# Patient Record
Sex: Female | Born: 1975 | Hispanic: Yes | Marital: Single | Smoking: Never smoker
Health system: Southern US, Community
[De-identification: ages and names within clinical notes are randomized; demographics above are authoritative.]

## PROBLEM LIST (undated history)

## (undated) DIAGNOSIS — K219 Gastro-esophageal reflux disease without esophagitis: Secondary | ICD-10-CM

## (undated) DIAGNOSIS — T7840XA Allergy, unspecified, initial encounter: Secondary | ICD-10-CM

## (undated) DIAGNOSIS — G5700 Lesion of sciatic nerve, unspecified lower limb: Secondary | ICD-10-CM

## (undated) DIAGNOSIS — E785 Hyperlipidemia, unspecified: Secondary | ICD-10-CM

## (undated) HISTORY — DX: Lesion of sciatic nerve, unspecified lower limb: G57.00

## (undated) HISTORY — PX: COLONOSCOPY: SHX174

## (undated) HISTORY — PX: ORIF ANKLE FRACTURE BIMALLEOLAR: SUR920

## (undated) HISTORY — PX: HARDWARE REMOVAL: SHX979

## (undated) HISTORY — DX: Gastro-esophageal reflux disease without esophagitis: K21.9

## (undated) HISTORY — DX: Hyperlipidemia, unspecified: E78.5

## (undated) HISTORY — DX: Allergy, unspecified, initial encounter: T78.40XA

## (undated) HISTORY — PX: UPPER GASTROINTESTINAL ENDOSCOPY: SHX188

---

## 2020-06-20 ENCOUNTER — Ambulatory Visit (INDEPENDENT_AMBULATORY_CARE_PROVIDER_SITE_OTHER): Payer: 59 | Admitting: Family Medicine

## 2020-06-20 ENCOUNTER — Encounter: Payer: Self-pay | Admitting: Family Medicine

## 2020-06-20 ENCOUNTER — Telehealth: Payer: Self-pay

## 2020-06-20 ENCOUNTER — Other Ambulatory Visit: Payer: Self-pay

## 2020-06-20 VITALS — BP 114/76 | HR 88 | Temp 98.2°F | Ht 62.0 in | Wt 191.6 lb

## 2020-06-20 DIAGNOSIS — Z1159 Encounter for screening for other viral diseases: Secondary | ICD-10-CM

## 2020-06-20 DIAGNOSIS — Z8742 Personal history of other diseases of the female genital tract: Secondary | ICD-10-CM | POA: Diagnosis not present

## 2020-06-20 DIAGNOSIS — Z8781 Personal history of (healed) traumatic fracture: Secondary | ICD-10-CM | POA: Diagnosis not present

## 2020-06-20 DIAGNOSIS — Z131 Encounter for screening for diabetes mellitus: Secondary | ICD-10-CM | POA: Diagnosis not present

## 2020-06-20 DIAGNOSIS — Z13220 Encounter for screening for lipoid disorders: Secondary | ICD-10-CM

## 2020-06-20 DIAGNOSIS — N912 Amenorrhea, unspecified: Secondary | ICD-10-CM

## 2020-06-20 DIAGNOSIS — Z1231 Encounter for screening mammogram for malignant neoplasm of breast: Secondary | ICD-10-CM

## 2020-06-20 DIAGNOSIS — M5416 Radiculopathy, lumbar region: Secondary | ICD-10-CM | POA: Diagnosis not present

## 2020-06-20 DIAGNOSIS — M5432 Sciatica, left side: Secondary | ICD-10-CM

## 2020-06-20 DIAGNOSIS — E785 Hyperlipidemia, unspecified: Secondary | ICD-10-CM | POA: Insufficient documentation

## 2020-06-20 DIAGNOSIS — Z114 Encounter for screening for human immunodeficiency virus [HIV]: Secondary | ICD-10-CM

## 2020-06-20 DIAGNOSIS — R7303 Prediabetes: Secondary | ICD-10-CM | POA: Insufficient documentation

## 2020-06-20 LAB — LIPID PANEL
Cholesterol: 241 mg/dL — ABNORMAL HIGH (ref 0–200)
HDL: 43.8 mg/dL (ref >39.00)
NonHDL: 197.65
Total CHOL/HDL Ratio: 6
Triglycerides: 276 mg/dL — ABNORMAL HIGH (ref 0.0–149.0)
VLDL: 55.2 mg/dL — ABNORMAL HIGH (ref 0.0–40.0)

## 2020-06-20 LAB — BASIC METABOLIC PANEL
BUN: 12 mg/dL (ref 6–23)
CO2: 30 mEq/L (ref 19–32)
Calcium: 9.7 mg/dL (ref 8.4–10.5)
Chloride: 101 mEq/L (ref 96–112)
Creatinine, Ser: 0.61 mg/dL (ref 0.40–1.20)
GFR: 107.51 mL/min (ref >60.00)
Glucose, Bld: 108 mg/dL — ABNORMAL HIGH (ref 70–99)
Potassium: 4.1 mEq/L (ref 3.5–5.1)
Sodium: 139 mEq/L (ref 135–145)

## 2020-06-20 LAB — HEMOGLOBIN A1C: Hgb A1c MFr Bld: 6.4 % (ref 4.6–6.5)

## 2020-06-20 LAB — LDL CHOLESTEROL, DIRECT: Direct LDL: 152 mg/dL

## 2020-06-20 MED ORDER — IBUPROFEN 800 MG PO TABS: 800.0000 mg | ORAL_TABLET | Freq: Three times a day (TID) | ORAL | 5 refills | Status: AC | PRN

## 2020-06-20 NOTE — Telephone Encounter (Signed)
Patient will need to have blood work done when she comes back on 06/26/20 appt due to not enough blood was drawn and provider added more labs 1 hour after she was gone.   Dm/cma

## 2020-06-20 NOTE — Progress Notes (Signed)
[facility] [address] [place] [address] Dept: [phone] Dept Fax: [phone]  New Patient Office Visit  Subjective:    Patient ID: Theresa Hinton, female    DOB: [birth year], 45 y.o..   MRN: [id number]  Chief Complaint  Patient presents with  . Establish Care    NP- CPE/labs.  Fasting today.  C/o weight gain despite dieting, sciatic pain, and bilateral foot pain(LT worse). Declines flu shot.     History of Present Illness:  Visit was assisted by an interpreter Cletus Gash, [place] # [phone])  Patient is in today to establish care. Ms. Theresa Hinton is an immigrant from [place] having moved to the [place] about 17 years ago. She previously lived in [place]. She has a long-standing relationship with her S.O. She has two children (17, 11). 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 She denies tobacco and drug use. She admits to social drinking on occasion.  Ms. Theresa Hinton has a history of low back pain associated with a pinched nerve. These symptoms began after she had a fall in 2014. She notes she had an MRI that apparently demonstrated a pinched nerve. She was referred for physical therapy. She also had a series of injections, which she did not find to have helped her issue. She has some associated sciatica into the left buttocks region. She uses ibuprofen 800 mg occasionally to help with the pain, finding lesser doses are not as helpful. She does not take this daily.  Ms. Theresa Hinton has a past history of a fracture of the left ankle. She had an ORIF about 7 years ago and then subsequent hardware removal. She does get some pain and tenderness around the ankle at times, for which she takes ibuprofen as well.  Ms. Theresa Hinton notes she had a past history of left breast swelling and tenderness. She underwent a mammogram and was told this was a "gland" and not cancer in the breast. She remains a bit  concerned about her breast health.  Ms. Theresa Hinton also notes she entered in menopause about 5 years ago. She started to have menstrual irregularity around age 46, with some missed periods and other prolonged periods. She was on contraceptives for a bit of time in the past.  Past Medical History: Patient Active Problem List   Diagnosis Date Noted  . Sciatica of left side 06/20/2020  . History of breast pain 06/20/2020  . Left lumbar radiculitis 06/20/2020  . History of fracture of left ankle 06/20/2020   Past Surgical History:  Procedure Laterality Date  . CESAREAN SECTION    . HARDWARE REMOVAL Left   . ORIF ANKLE FRACTURE BIMALLEOLAR Left    Family History  Problem Relation Age of Onset  . Hyperlipidemia Mother   . Diabetes Father    Outpatient Medications Prior to Visit  Medication Sig Dispense Refill  . ibuprofen (ADVIL) 800 MG tablet Take 800 mg by mouth every 8 (eight) hours as needed.     No facility-administered medications prior to visit.   No Known Allergies    Objective:   Today's Vitals   06/20/20 0916  BP: 114/76  Pulse: 88  Temp: 98.2 F (36.8 C)  TempSrc: Temporal  SpO2: 95%  Weight: 191 lb 9.6 oz (86.9 kg)  Height: 5\' 2"  (1.575 m)   Body mass index is 35.04 kg/m.   General: Well developed, well nourished. No acute distress. Back: Pain to palpation over the left paraspinal muscles and down  the left posterior buttocks. Extremities: + SLR on the left. Well-healed surgical scars aroudn the left ankle. There is moderate tenderness to   palpation. Psych: Alert and oriented. Normal mood and affect.  Health Maintenance Due  Topic Date Due  . Hepatitis C Screening  Never done  . TETANUS/TDAP  Never done  . PAP SMEAR-Modifier  Never done  . COLONOSCOPY (Pts 45-35yrs Insurance coverage will need to be confirmed)  Never done  . INFLUENZA VACCINE  Never done  . COVID-19 Vaccine (3 - Booster for Pfizer series) 03/30/2020     Assessment & Plan:   1.  Sciatica of left side Ms. Theresa Hinton seems to be managing with ibuprofen for this condition. I am okay with her continueing intermittent use, but want to assess renal function. We might consider a repeat referral to PT, if this persists.  - Basic metabolic panel - ibuprofen (ADVIL) 800 MG tablet; Take 1 tablet (800 mg total) by mouth every 8 (eight) hours as needed.  Dispense: 30 tablet; Refill: 5  2. Left lumbar radiculitis Stable on ibuprofen. If she becomes more symptomatic, she may need referral to a local neurosurgeon for evaluation.  - ibuprofen (ADVIL) 800 MG tablet; Take 1 tablet (800 mg total) by mouth every 8 (eight) hours as needed.  Dispense: 30 tablet; Refill: 5  3. History of breast pain We discussed whether to continue breast cancer screening at this point, It is reasonable for her to have a mammogram at 47, esp. due to the past concerns, so will refer her.  - MM DIGITAL SCREENING BILATERAL; Future  4. History of fracture of left ankle Patient is managing with ankle discomfort at this point. I would consider referral or PT int he future, if the issue starts to impair her ability to work.  5. Amenorrhea Recommend we confirm menopause, as she was  younger than expected when she stopped her menses.  - FSH/LH  6. Screening for diabetes mellitus (DM)  - Hemoglobin A1c  7. Screening for lipid disorders  - Lipid panel  8. Encounter for hepatitis C screening test for low risk patient  - HCV Ab w Reflex to Quant PCR  9. Screening for HIV (human immunodeficiency virus)  - HIV Antibody (routine testing w rflx)   Haydee Salter, MD

## 2020-06-26 ENCOUNTER — Ambulatory Visit (INDEPENDENT_AMBULATORY_CARE_PROVIDER_SITE_OTHER): Payer: 59 | Admitting: Family Medicine

## 2020-06-26 ENCOUNTER — Other Ambulatory Visit: Payer: Self-pay

## 2020-06-26 ENCOUNTER — Encounter: Payer: Self-pay | Admitting: Family Medicine

## 2020-06-26 ENCOUNTER — Other Ambulatory Visit (HOSPITAL_COMMUNITY)
Admission: RE | Admit: 2020-06-26 | Discharge: 2020-06-26 | Disposition: A | Discharge status: Home / Self Care | Payer: 59 | Source: Ambulatory Visit | Attending: Diagnostic Radiology | Admitting: Diagnostic Radiology

## 2020-06-26 VITALS — BP 118/76 | HR 92 | Temp 97.8°F | Ht 62.0 in | Wt 192.4 lb

## 2020-06-26 DIAGNOSIS — R7303 Prediabetes: Secondary | ICD-10-CM | POA: Diagnosis not present

## 2020-06-26 DIAGNOSIS — Z8742 Personal history of other diseases of the female genital tract: Secondary | ICD-10-CM

## 2020-06-26 DIAGNOSIS — E785 Hyperlipidemia, unspecified: Secondary | ICD-10-CM | POA: Diagnosis not present

## 2020-06-26 DIAGNOSIS — M7711 Lateral epicondylitis, right elbow: Secondary | ICD-10-CM | POA: Diagnosis not present

## 2020-06-26 DIAGNOSIS — Z124 Encounter for screening for malignant neoplasm of cervix: Secondary | ICD-10-CM | POA: Diagnosis present

## 2020-06-26 MED ORDER — NAPROXEN SODIUM 220 MG PO TABS: 220.0000 mg | ORAL_TABLET | Freq: Two times a day (BID) | ORAL | 0 refills | Status: AC

## 2020-06-26 NOTE — Progress Notes (Signed)
[facility] [address] [place] [address] Dept: [phone] Dept Fax: [phone]  Acute Office Visit  Subjective:    Patient ID: Theresa Hinton, female    DOB: [birth year], 45 y.o..   MRN: [id number]  Chief Complaint  Patient presents with  . Gynecologic Exam    Pap only, also want to know how to get a mammogram without insurance.  Discuss results from last week.     History of Present Illness:  Patient is in today for pap smear screening and to discuss the results of her recent lab tests. She notes that she continues to have some episodic low back pain, esp. associated with her work. More recently, she has been having pain in her right elbow. This is esp. tender during and after work.  Past Medical History: Patient Active Problem List   Diagnosis Date Noted  . Sciatica of left side 06/20/2020  . History of breast pain 06/20/2020  . Left lumbar radiculitis 06/20/2020  . History of fracture of left ankle 06/20/2020  . Prediabetes 06/20/2020  . Borderline hyperlipidemia 06/20/2020   Past Surgical History:  Procedure Laterality Date  . CESAREAN SECTION    . HARDWARE REMOVAL Left   . ORIF ANKLE FRACTURE BIMALLEOLAR Left    Family History  Problem Relation Age of Onset  . Hyperlipidemia Mother   . Diabetes Father    Outpatient Medications Prior to Visit  Medication Sig Dispense Refill  . ibuprofen (ADVIL) 800 MG tablet Take 1 tablet (800 mg total) by mouth every 8 (eight) hours as needed. 30 tablet 5   No facility-administered medications prior to visit.   No Known Allergies    Objective:   Today's Vitals   06/26/20 1529  BP: 118/76  Pulse: 92  Temp: 97.8 F (36.6 C)  TempSrc: Temporal  SpO2: 98%  Weight: 192 lb 6.4 oz (87.3 kg)  Height: 5\' 2"  (1.575 m)   Body mass index is 35.19 kg/m.   General: Well developed, well nourished. No acute distress. Extremities: Pain localized over right  lateral epicondyle No joint swelling or tenderness. No edema noted. GU: External genitalia normal. Vaginal mucosa is pink. Cervix pink without lesions or exudates. No palpable utere or   adnexal masses. Psych: Alert and oriented. Normal mood and affect.  Health Maintenance Due  Topic Date Due  . Hepatitis C Screening  Never done  . TETANUS/TDAP  Never done  . PAP SMEAR-Modifier  Never done  . COLONOSCOPY (Pts 45-31yrs Insurance coverage will need to be confirmed)  Never done  . INFLUENZA VACCINE  Never done  . COVID-19 Vaccine (3 - Booster for Pfizer series) 03/30/2020   Lab results:  Lab Results  Component Value Date   HGBA1C 6.4 06/20/2020   Lab Results  Component Value Date   CHOL 241 (H) 06/20/2020   HDL 43.80 06/20/2020   LDLDIRECT 152.0 06/20/2020   TRIG 276.0 (H) 06/20/2020   CHOLHDL 6 06/20/2020     Assessment & Plan:   1. Encounter for Papanicolaou smear for cervical cancer screening  2. Prediabetes We discussed her HbA1c being in the prediabetes range. I recommended she work to lose weight through dietary changes and exercise. She is already working on improving her diet. I suggested a goal of 30 min. of walking 3-4 times a week.  3. Borderline hyperlipidemia Discussed that current elevations are borderline high. Her ACC/AHA 10-year cardiac risk is low (<2%). The same advice concerning reducing risk for diabetes  applies.  4. History of breast pain She notes she was unable to get her insurance to approve her mammogram. The staff will work to recheck on approval or to find options to help her afford this.  5. Lateral epicondylitis of right elbow Her exam is consistent with lateral epicondylitis.She was provided patient information in Spanish regarding the condition. I recommend she apply ice to this area after work. I will place her on a 2 week course of Aleve to see if we can get this to resolve. If this persists, would consider physical therapy referral.  -  naproxen sodium (ALEVE) 220 MG tablet; Take 1 tablet (220 mg total) by mouth in the morning and at bedtime for 14 days.  Dispense: 28 tablet; Refill: 0  Haydee Salter, MD

## 2020-06-26 NOTE — Addendum Note (Signed)
Addended by: Haydee Salter on: 06/26/2020 04:21 PM   Modules accepted: Orders

## 2020-06-26 NOTE — Patient Instructions (Signed)
Codo de [place] Tennis Elbow  El codo de tenista es la irritacin e hinchazn (inflamacin) en la zona externa del Joycie Peek, cerca del codo. La hinchazn afecta los tejidos que conectan el msculo al hueso (tendones). El codo de [place] puede presentarse al Psychologist, prison and probation services cualquier deporte o Optometrist una tarea que exige el uso excesivo del codo. La causa del codo de [place] es la repeticin del mismo movimiento una y Elmon Kirschner. Cules son las causas? Esta afeccin a menudo se produce por practicar deportes o realizar tareas en los que tiene que mover el antebrazo de la [place]. A veces, la afeccin puede deberse a una lesin repentina. Qu incrementa el riesgo? Tiene ms probabilidades de tener codo de tenista si juega al tenis o practica otro deporte con raqueta. Tambin tiene un mayor riesgo si [place] frecuentemente las manos para Fish farm manager. Esto puede comprender lo siguiente:  Usuarios de computadoras.  Trabajadores de Programme researcher, broadcasting/film/video.  Personas que trabajan en una fbrica.  Msicos.  Cocineros.  Cajeros. Cules son los signos o sntomas?  Dolor y sensibilidad a la palpacin en el Management consultant y la parte externa del codo. Puede sentir dolor todo Physiological scientist o solo cuando [place] el brazo.  Sensacin de ardor. Esta empieza en el codo y se extiende por el brazo.  Agarre dbil en la mano. Cmo se trata? Descansar el brazo y Midwife hielo es Sports coach. El mdico tambin puede recomendarle:  Medicamentos para reducir Conservation officer, historic buildings y la hinchazn.  Una correa para codo.  Fisioterapia. Este tratamiento puede incluir masajes o ejercicios, o ambos.  Un dispositivo ortopdico para el codo. Si estos no ayudan a que sus sntomas mejoren, el mdico puede recomendarle [place]. Siga estas instrucciones en su casa: Si tiene un dispositivo ortopdico o una correa:  Use el dispositivo ortopdico o la correa como se lo haya indicado el mdico. Quteselos solamente como se lo haya indicado  el mdico.  Controle la piel alrededor del dispositivo ortopdico o la correa todos [place]. Comunquese con su mdico si ve algn problema.  Afljeselos si los dedos de la mano: ? Hormiguean. ? Se adormecen. ? Se tornan fros y de [employer].  Mantenga la correa o el dispositivo ortopdico limpio.  Si el dispositivo ortopdico o la correa no son impermeables: ? No deje que se mojen. ? Cbralos con un envoltorio hermtico cuando tome un bao de inmersin o una ducha. Control del dolor, la rigidez y la hinchazn  Aplique hielo sobre la zona lesionada si se lo indican. Para hacer esto: ? Si tiene un dispositivo ortopdico o una correa desmontable, quteselo como se lo haya indicado el mdico. ? Ponga el hielo en una bolsa plstica. ? Coloque una [employer] piel y Therapist, nutritional. ? Aplique el hielo durante 64minutos, 2 o 3veces por da. ? Retire el hielo si la piel se le pone de color rojo brillante. Esto es [employer]. Si no puede sentir dolor, calor o fro, tiene un mayor riesgo de que se dae la zona.  Mueva los dedos con frecuencia.   Actividad  Descanse el codo y la [place]. Evite las actividades que pueden causar problemas en el codo, como se lo haya indicado el mdico.  Haga los ejercicios como se lo haya indicado el mdico.  Si levanta un objeto, hgalo con la palma de la mano [place] arriba. Estilo de vida  Si el codo de [place] se debe a los deportes, revise el equipo y Chief Strategy Officer de lo  siguiente: ? Que lo est utilizando de forma correcta. ? Que es apto para usted.  Si el codo de [place] se debe a su trabajo o al uso de una computadora, tmese descansos con frecuencia para Publishing rights manager. Hable con su gerente sobre cmo puede hacer que su afeccin mejore en el trabajo. Instrucciones generales  [employer] de venta libre y los recetados solamente como se lo haya indicado el mdico.  No fume ni consuma ningn producto que contenga nicotina o tabaco. Si  necesita ayuda para dejar de consumir estos productos, consulte al mdico.  Cumpla con todas las visitas de seguimiento. Cmo se evita?  Antes y despus de estar activo: ? Precaliente y elongue adecuadamente antes de hacer actividad fsica. ? Reljese y elongue despus de hacer actividad fsica. ? Dele al cuerpo tiempo para [employer].  Mientras est activo: ? Asegrese de usar el equipo que sea apto para usted. ? Si juega al tenis, dele potencia a su golpe con la parte inferior del cuerpo. Evite usar Building surveyor.  Mantenga un estado fsico adecuado. Esto puede comprender lo siguiente: ? [place]. ? Flexibilidad. ? Resistencia.  Haga ejercicios para fortalecer los msculos del Management consultant. Comunquese con un mdico si:  El dolor no mejora con Dispensing optician.  El dolor [place].  Tiene debilidad en el antebrazo, la mano o los dedos de la [place].  No puede sentir el antebrazo, la mano o los dedos de la [place]. Solicite ayuda de inmediato si:  El dolor es muy intenso.  No puede mover la [place]. Resumen  El codo de [place] es la irritacin e hinchazn (inflamacin) en la zona externa del Joycie Peek, cerca del codo.  Su causa es la repeticin del mismo movimiento una y Elmon Kirschner.  Descanse el codo y la [place]. Evite las actividades de acuerdo con lo que le indique su mdico.  Si se lo indican, aplique hielo sobre la zona lesionada durante 20 minutos, de 2 a Occupational psychologist. Esta informacin no tiene Marine scientist el consejo del mdico. Asegrese de hacerle al mdico cualquier pregunta que tenga. Document Revised: 11/13/2019 Document Reviewed: 11/13/2019 Elsevier Patient Education  2021 [employer].

## 2020-07-01 LAB — CYTOLOGY - PAP: Diagnosis: NEGATIVE

## 2020-07-03 ENCOUNTER — Ambulatory Visit: Payer: 59

## 2021-05-15 ENCOUNTER — Encounter: Payer: Self-pay | Admitting: Nurse Practitioner

## 2021-05-15 ENCOUNTER — Ambulatory Visit (INDEPENDENT_AMBULATORY_CARE_PROVIDER_SITE_OTHER): Payer: 59

## 2021-05-15 ENCOUNTER — Ambulatory Visit (INDEPENDENT_AMBULATORY_CARE_PROVIDER_SITE_OTHER): Payer: 59 | Admitting: Nurse Practitioner

## 2021-05-15 ENCOUNTER — Telehealth: Payer: Self-pay

## 2021-05-15 ENCOUNTER — Other Ambulatory Visit: Payer: Self-pay

## 2021-05-15 VITALS — BP 126/81 | HR 82 | Temp 96.9°F | Ht 62.0 in | Wt 181.4 lb

## 2021-05-15 DIAGNOSIS — M25512 Pain in left shoulder: Secondary | ICD-10-CM | POA: Diagnosis not present

## 2021-05-15 MED ORDER — KETOROLAC TROMETHAMINE 60 MG/2ML IM SOLN
30.0000 mg | Freq: Once | INTRAMUSCULAR | Status: AC
  Administered 2021-05-15: 30 mg via INTRAMUSCULAR

## 2021-05-15 NOTE — Progress Notes (Signed)
Acute Office Visit  Subjective:    Patient ID: Theresa Hinton, female    DOB: 1975/07/08, 46 y.o.   MRN: [id number]  Chief Complaint  Patient presents with   Shoulder Pain    Pt c/o left shoulder pain x1 month w/ limit mobility due to pain.    This visit was completed with use of an interpreter.   HPI Patient is in today for left shoulder pain for 1 month. She does not remember any trauma or injury. 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   SHOULDER PAIN  Duration:  off and on for a month, however today it is worse Involved shoulder: left Mechanism of injury: unknown Location: anterior Onset:gradual Severity: severe  Quality: pulsating, throbbing Frequency: intermittent Radiation: yes - down her arm Aggravating factors: lifting, movement, sleep, and throwing  Alleviating factors: ice and NSAIDs  Status: worse Treatments attempted: rest, ice, and ibuprofen  Relief with NSAIDs?:  moderate Weakness: no Numbness: no Decreased grip strength: no Redness: no Swelling: no Bruising: no Fevers: no   Past Medical History:  Diagnosis Date   Hyperlipidemia    Sciatic nerve disease     Past Surgical History:  Procedure Laterality Date   CESAREAN SECTION     HARDWARE REMOVAL Left    ORIF ANKLE FRACTURE BIMALLEOLAR Left     Family History  Problem Relation Age of Onset   Hyperlipidemia Mother    Diabetes Father     Social History   Socioeconomic History   Marital status: Single    Spouse name: Not on file   Number of children: 2   Years of education: Not on file   Highest education level: Not on file  Occupational History   Occupation: Housekeeping  Tobacco Use   Smoking status: Never   Smokeless tobacco: Never  Vaping Use   Vaping Use: Never used  Substance and Sexual Activity   Alcohol use: Yes    Comment: socially   Drug use: Never   Sexual activity: Yes  Other Topics Concern   Not on file  Social History Narrative   Not on  file   Social Determinants of Health   Financial Resource Strain: Not on file  Food Insecurity: Not on file  Transportation Needs: Not on file  Physical Activity: Not on file  Stress: Not on file  Social Connections: Not on file  Intimate Partner Violence: Not on file    Outpatient Medications Prior to Visit  Medication Sig Dispense Refill   ibuprofen (ADVIL) 800 MG tablet Take 1 tablet (800 mg total) by mouth every 8 (eight) hours as needed. 30 tablet 5   No facility-administered medications prior to visit.    No Known Allergies  Review of Systems See pertinent positives and negatives per HPI.    Objective:    Physical Exam Vitals and nursing note reviewed.  Constitutional:      General: She is not in acute distress.    Appearance: Normal appearance.  HENT:     Head: Normocephalic.  Eyes:     Conjunctiva/sclera: Conjunctivae normal.  Cardiovascular:     Rate and Rhythm: Normal rate.  Pulmonary:     Effort: Pulmonary effort is normal.  Musculoskeletal:        General: Tenderness present. No swelling, deformity or signs of injury.     Cervical back: Normal range of motion.     Comments: Very limited ROM to left shoulder with severe pain  Skin:  General: Skin is warm.  Neurological:     General: No focal deficit present.     Mental Status: She is alert and oriented to person, place, and time.  Psychiatric:        Mood and Affect: Mood normal.        Behavior: Behavior normal.        Thought Content: Thought content normal.        Judgment: Judgment normal.    BP 126/81 (BP Location: Right Arm, Patient Position: Sitting, Cuff Size: Normal)    Pulse 82    Temp (!) 96.9 F (36.1 C) (Temporal)    Ht 5\' 2"  (1.575 m)    Wt 181 lb 6.4 oz (82.3 kg)    SpO2 100%    BMI 33.18 kg/m  Wt Readings from Last 3 Encounters:  05/15/21 181 lb 6.4 oz (82.3 kg)  06/26/20 192 lb 6.4 oz (87.3 kg)  06/20/20 191 lb 9.6 oz (86.9 kg)    Health Maintenance Due  Topic Date Due    Hepatitis C Screening  Never done   TETANUS/TDAP  Never done   COLONOSCOPY (Pts 45-28yrs Insurance coverage will need to be confirmed)  Never done   COVID-19 Vaccine (3 - Booster for [place] series) 11/24/2019   INFLUENZA VACCINE  Never done    There are no preventive care reminders to display for this patient.   No results found for: TSH No results found for: WBC, HGB, HCT, MCV, PLT Lab Results  Component Value Date   NA 139 06/20/2020   K 4.1 06/20/2020   CO2 30 06/20/2020   GLUCOSE 108 (H) 06/20/2020   BUN 12 06/20/2020   CREATININE 0.61 06/20/2020   CALCIUM 9.7 06/20/2020   GFR 107.51 06/20/2020   Lab Results  Component Value Date   CHOL 241 (H) 06/20/2020   Lab Results  Component Value Date   HDL 43.80 06/20/2020   No results found for: [facility] Lab Results  Component Value Date   TRIG 276.0 (H) 06/20/2020   Lab Results  Component Value Date   CHOLHDL 6 06/20/2020   Lab Results  Component Value Date   HGBA1C 6.4 06/20/2020       Assessment & Plan:   Problem List Items Addressed This Visit       Other   Acute pain of left shoulder - Primary    Acute x4 weeks. Does not remember injury/trauma. 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 Will check an x-ray of her left shoulder today. Toradol 30mg  IM given in office today. Continue ibupfrofen, heat, biofreeze as needed. Referral placed to orthopedics. Note given to remain out of work until Monday 05/19/21. Follow up in 4 weeks.       Relevant Orders   DG Shoulder Left   Ambulatory referral to Orthopedic Surgery     Meds ordered this encounter  Medications   ketorolac (TORADOL) injection 30 mg     Charyl Dancer, NP

## 2021-05-15 NOTE — Telephone Encounter (Signed)
LMOM- per Cassandra patient needs Appt for shoulder.

## 2021-05-15 NOTE — Patient Instructions (Addendum)
It was great to see you!  We will call you with the x-ray results and call an appointment for orthopedics. Continue ibuprofen, ice, heat, and rubs as needed  Let's follow-up in 4 weeks, sooner if you have concerns.  If a referral was placed today, you will be contacted for an appointment. Please note that routine referrals can sometimes take up to 3-4 weeks to process. Please call our office if you haven't heard anything after this time frame.  Take care,  Vance Peper, NP

## 2021-05-15 NOTE — Assessment & Plan Note (Addendum)
Acute x4 weeks. Does not remember injury/trauma. 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 Will check an x-ray of her left shoulder today. Toradol 30mg  IM given in office today. Continue ibupfrofen, heat, biofreeze as needed. Referral placed to orthopedics. Note given to remain out of work until Monday 05/19/21. Follow up in 4 weeks.

## 2021-05-16 ENCOUNTER — Ambulatory Visit (INDEPENDENT_AMBULATORY_CARE_PROVIDER_SITE_OTHER): Payer: 59 | Admitting: Physician Assistant

## 2021-05-16 ENCOUNTER — Telehealth: Payer: Self-pay | Admitting: Physician Assistant

## 2021-05-16 DIAGNOSIS — M7532 Calcific tendinitis of left shoulder: Secondary | ICD-10-CM | POA: Diagnosis not present

## 2021-05-16 MED ORDER — METHYLPREDNISOLONE ACETATE 40 MG/ML IJ SUSP
40.0000 mg | INTRAMUSCULAR | Status: AC | PRN
  Administered 2021-05-16: 40 mg via INTRA_ARTICULAR

## 2021-05-16 MED ORDER — HYDROCODONE-ACETAMINOPHEN 5-325 MG PO TABS: 1.0000 | ORAL_TABLET | Freq: Four times a day (QID) | ORAL | 0 refills | Status: AC | PRN

## 2021-05-16 MED ORDER — LIDOCAINE HCL 1 % IJ SOLN
5.0000 mL | INTRAMUSCULAR | Status: AC | PRN
  Administered 2021-05-16: 5 mL

## 2021-05-16 NOTE — Progress Notes (Signed)
Office Visit Note   Patient: Theresa Hinton           Date of Birth: 06-Nov-1975           MRN: [id number] Visit Date: 05/16/2021              Requested by: Charyl Dancer, NP [place],   [id number] PCP: Haydee Salter, MD  Chief Complaint  Patient presents with   Left Shoulder - Pain      HPI: Patient is a pleasant 46 year old woman who is right-hand dominant.  She is seen today with the assistance of an interpreter.  She presents with a 4-week history of increasing left shoulder pain.  She admits she has been having trouble with her shoulder times several months but over the "last 4 weeks its been getting worse.  She denies any particular injury but does say she does quite a bit of repetitive use of her arm she works for a [employer].  She was seen in urgent care and given a injection of tramadol which really did not help her much.  She points to pain over her anterior shoulder.  Has difficulty with range of motion secondary to the pain  Assessment & Plan: Visit Diagnoses: No diagnosis found.  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  We will try an injection today.  She understands this may hurt more before it gets better.  I will put her off work for a week.  She understands if this does not improve she will contact us we will order an MRI.  I gave her a few hydrocodone to use sparingly.  X-rays taken yesterday do demonstrate calcific tendinitis  Follow-Up Instructions: No follow-ups on file.   Ortho Exam  Patient is alert, oriented, no adenopathy, well-dressed, normal affect, normal respiratory effort. Examination she is extremely difficult to examine secondary to pain.  Even elevation of her arm is quite painful to her.  She is especially tender in the anterior subacromial area.  She cannot attempt to place her arm overhead just because of the pain she is having.  Distally CMS is intact.  Rotation of the  shoulder also causes significant pain  Imaging: No results found. No images are attached to the encounter.  Labs: Lab Results  Component Value Date   HGBA1C 6.4 06/20/2020     No results found for: ALBUMIN, PREALBUMIN, CBC  No results found for: MG No results found for: VD25OH  No results found for: PREALBUMIN No flowsheet data found.   There is no height or weight on file to calculate BMI.  Orders:  No orders of the defined types were placed in this encounter.  Meds ordered this encounter  Medications   HYDROcodone-acetaminophen (NORCO/VICODIN) 5-325 MG tablet    Sig: Take 1 tablet by mouth every 6 (six) hours as needed for moderate pain.    Dispense:  20 tablet    Refill:  0     Procedures: Large Joint Inj: L subacromial bursa on 05/16/2021 11:30 AM Indications: diagnostic evaluation and pain Details: 25 G 1.5 in needle, anterior approach  Arthrogram: No  Medications: 5 mL lidocaine 1 %; 40 mg methylPREDNISolone acetate 40 MG/ML Outcome: tolerated well, no immediate complications Procedure, treatment alternatives, risks and benefits explained, specific risks discussed. Consent was given by the patient.     Clinical Data: No additional findings.  ROS:  All other systems negative, except as noted in the HPI. Review  of Systems  Objective: Vital Signs: There were no vitals taken for this visit.  Specialty Comments:  No specialty comments available.  PMFS History: Patient Active Problem List   Diagnosis Date Noted   Acute pain of left shoulder 05/15/2021   Sciatica of left side 06/20/2020   History of breast pain 06/20/2020   Left lumbar radiculitis 06/20/2020   History of fracture of left ankle 06/20/2020   Prediabetes 06/20/2020   Borderline hyperlipidemia 06/20/2020   Past Medical History:  Diagnosis Date   Hyperlipidemia    Sciatic nerve disease     Family History  Problem Relation Age of Onset   Hyperlipidemia Mother    Diabetes Father      Past Surgical History:  Procedure Laterality Date   CESAREAN SECTION     HARDWARE REMOVAL Left    ORIF ANKLE FRACTURE BIMALLEOLAR Left    Social History   Occupational History   Occupation: Housekeeping  Tobacco Use   Smoking status: Never   Smokeless tobacco: Never  Vaping Use   Vaping Use: Never used  Substance and Sexual Activity   Alcohol use: Yes    Comment: socially   Drug use: Never   Sexual activity: Yes

## 2021-05-16 NOTE — Telephone Encounter (Signed)
Patient had appt today

## 2021-05-16 NOTE — Telephone Encounter (Signed)
Patient called needing to speak with someone who speaks spanish. The number to contact patient is [phone]

## 2021-05-20 ENCOUNTER — Telehealth: Payer: Self-pay | Admitting: Physician Assistant

## 2021-05-20 ENCOUNTER — Other Ambulatory Visit: Payer: Self-pay

## 2021-05-20 DIAGNOSIS — M7532 Calcific tendinitis of left shoulder: Secondary | ICD-10-CM

## 2021-05-20 NOTE — Telephone Encounter (Signed)
Pt called and states that she needs to get an injection in her shoulder she thinks. Can you please advise?   CB [phone]

## 2021-05-20 NOTE — Telephone Encounter (Signed)
Spoke with son. MRI has been ordered. He request that we call him for scheduling because his mother is limited in [place] and he does not want to use the interpreter service.

## 2021-05-20 NOTE — Telephone Encounter (Signed)
She just had an injection on Friday. She needs to give it a week.

## 2021-05-20 NOTE — Telephone Encounter (Signed)
Patient's son called for his mother who doesn't speak english said her shoulder is still hurting and would like to have an MRI. The number to contact Braulio Bosch is [phone]

## 2021-05-23 ENCOUNTER — Telehealth: Payer: Self-pay | Admitting: Physician Assistant

## 2021-05-23 NOTE — Telephone Encounter (Signed)
Pt husband calling and wanting to know if pt can have a light duty work note because she is in a lot of pain and the MRI has not been scheduled yet.   CB [phone]

## 2021-05-23 NOTE — Telephone Encounter (Signed)
Please advise 

## 2021-05-26 NOTE — Telephone Encounter (Signed)
Spoke with patient. She will pick up note today.

## 2021-08-07 ENCOUNTER — Ambulatory Visit (INDEPENDENT_AMBULATORY_CARE_PROVIDER_SITE_OTHER): Payer: Managed Care, Other (non HMO) | Admitting: Nurse Practitioner

## 2021-08-07 ENCOUNTER — Encounter: Payer: Self-pay | Admitting: Nurse Practitioner

## 2021-08-07 VITALS — BP 122/84 | HR 85 | Temp 97.8°F | Resp 18 | Wt 185.0 lb

## 2021-08-07 DIAGNOSIS — J029 Acute pharyngitis, unspecified: Secondary | ICD-10-CM | POA: Diagnosis not present

## 2021-08-07 DIAGNOSIS — J40 Bronchitis, not specified as acute or chronic: Secondary | ICD-10-CM | POA: Diagnosis not present

## 2021-08-07 LAB — POCT INFLUENZA A/B
Influenza A, POC: NEGATIVE
Influenza B, POC: NEGATIVE

## 2021-08-07 LAB — POCT RAPID STREP A (OFFICE): Rapid Strep A Screen: NEGATIVE

## 2021-08-07 MED ORDER — ALBUTEROL SULFATE HFA 108 (90 BASE) MCG/ACT IN AERS: 2.0000 | INHALATION_SPRAY | Freq: Four times a day (QID) | RESPIRATORY_TRACT | 0 refills | Status: AC | PRN

## 2021-08-07 MED ORDER — PREDNISONE 10 MG PO TABS: ORAL_TABLET | ORAL | 0 refills | Status: DC

## 2021-08-07 MED ORDER — BENZONATATE 100 MG PO CAPS: 100.0000 mg | ORAL_CAPSULE | Freq: Two times a day (BID) | ORAL | 0 refills | Status: AC | PRN

## 2021-08-07 MED ORDER — HYDROCOD POLI-CHLORPHE POLI ER 10-8 MG/5ML PO SUER: 5.0000 mL | Freq: Two times a day (BID) | ORAL | 0 refills | Status: AC | PRN

## 2021-08-07 NOTE — Patient Instructions (Signed)
It was great to see you! ? ?Start prednisone taper, 6 tablets today, 5 tomorrow, then keep decreasing by 1 every day until gone. ? ?Start tessalon as needed for cough and tussionex liquid for cough at bedtime. This can make you sleepy.  ? ?You can use an albuterol inhaler every 6 hours as needed for shortness of breath or chest tightness.  ? ?Let's follow-up if your symptoms worsen or don't improve ? ?Take care, ? ?Vance Peper, NP ? ?

## 2021-08-07 NOTE — Progress Notes (Signed)
? ?Acute Office Visit ? ?Subjective:  ? ?  ?Patient ID: Theresa Hinton, female    DOB: [birth year], 46 y.o.   MRN: [id number] ? ?Chief Complaint  ?Patient presents with  ? sick visit  ?  Cough sore throat, negative covid test, chest congestion no fever.   ? ? ?HPI ?Patient is in today for sore throat, chest congestion, and cough for the last 3 weeks. She had a negative covid-19 test at home.  ? ?UPPER RESPIRATORY TRACT INFECTION ? ?Fever: no ?Cough: yes ?Shortness of breath: yes - with coughing spells ?Wheezing: no ?Chest pain: no ?Chest tightness: yes ?Chest congestion: yes ?Nasal congestion: yes ?Runny nose: yes ?Post nasal drip: yes ?Sneezing: yes ?Sore throat: yes ?Swollen glands: no ?Sinus pressure: yes ?Headache: yes ?Face pain: no ?Toothache: no ?Ear pain: no bilateral ?Ear pressure: no bilateral ?Eyes red/itching:yes ?Eye drainage/crusting: no  ?Vomiting: no ?Rash: no ?Fatigue: yes ?Sick contacts: no ?Strep contacts: no  ?Context: worse ?Recurrent sinusitis: no ?Relief with OTC cold/cough medications: no  ?Treatments attempted: Claritin, Vicks vapor rub ? ? ?ROS ?See pertinent positives and negatives per HPI. ? ?   ?Objective:  ?  ?BP 122/84 (BP Location: Left Arm, Patient Position: Sitting, Cuff Size: Normal)   Pulse 85   Temp 97.8 ?F (36.6 ?C) (Temporal)   Resp 18   Wt 185 lb (83.9 kg)   SpO2 98%   BMI 33.84 kg/m?  ? ? ?Physical Exam ?Vitals and nursing note reviewed.  ?Constitutional:   ?   General: She is not in acute distress. ?   Appearance: Normal appearance.  ?HENT:  ?   Head: Normocephalic.  ?   Right Ear: Tympanic membrane, ear canal and external ear normal.  ?   Left Ear: Tympanic membrane, ear canal and external ear normal.  ?   Nose:  ?   Right Sinus: No maxillary sinus tenderness or frontal sinus tenderness.  ?   Left Sinus: No maxillary sinus tenderness or frontal sinus tenderness.  ?   Mouth/Throat:  ?   Pharynx: Posterior oropharyngeal erythema present. No oropharyngeal  exudate.  ?Eyes:  ?   Conjunctiva/sclera: Conjunctivae normal.  ?Cardiovascular:  ?   Rate and Rhythm: Normal rate and regular rhythm.  ?   Pulses: Normal pulses.  ?   Heart sounds: Normal heart sounds.  ?Pulmonary:  ?   Effort: Pulmonary effort is normal.  ?   Breath sounds: Wheezing present.  ?Musculoskeletal:  ?   Cervical back: Normal range of motion.  ?Skin: ?   General: Skin is warm.  ?Neurological:  ?   General: No focal deficit present.  ?   Mental Status: She is alert and oriented to person, place, and time.  ?Psychiatric:     ?   Mood and Affect: Mood normal.     ?   Behavior: Behavior normal.     ?   Thought Content: Thought content normal.     ?   Judgment: Judgment normal.  ? ? ?Results for orders placed or performed in visit on 08/07/21  ?POCT rapid strep A  ?Result Value Ref Range  ? Rapid Strep A Screen Negative Negative  ?POCT Influenza A/B  ?Result Value Ref Range  ? Influenza A, POC Negative Negative  ? Influenza B, POC Negative Negative  ? ? ? ?   ?Assessment & Plan:  ? ?Problem List Items Addressed This Visit   ?None ?Visit Diagnoses   ? ? Sore throat    -  Primary  ? POC flu and strep negative. Negative home covid-19 test. Can take tylenol, ibuprofen, gargle with warm salt water to help with pain.  ? Relevant Orders  ? POCT rapid strep A (Completed)  ? POCT Influenza A/B (Completed)  ? Bronchitis      ? With wheezing, will start prednisone taper. Tussionex and Tessalon as needed for cough.  PDMP reviewed. Drink plenty of fluids, rest.  Work note given.  ? ?  ? ? ?Meds ordered this encounter  ?Medications  ? predniSONE (DELTASONE) 10 MG tablet  ?  Sig: Take 6 tablets today, then 5 tablets tomorrow, then decrease by 1 tablet every day until gone  ?  Dispense:  21 tablet  ?  Refill:  0  ? albuterol (VENTOLIN HFA) 108 (90 Base) MCG/ACT inhaler  ?  Sig: Inhale 2 puffs into the lungs every 6 (six) hours as needed for wheezing or shortness of breath.  ?  Dispense:  8 g  ?  Refill:  0  ? benzonatate  (TESSALON) 100 MG capsule  ?  Sig: Take 1 capsule (100 mg total) by mouth 2 (two) times daily as needed for cough.  ?  Dispense:  20 capsule  ?  Refill:  0  ? chlorpheniramine-HYDROcodone (TUSSIONEX PENNKINETIC ER) 10-8 MG/5ML  ?  Sig: Take 5 mLs by mouth every 12 (twelve) hours as needed for cough.  ?  Dispense:  70 mL  ?  Refill:  0  ? ? ?Return if symptoms worsen or fail to improve. ? ?Charyl Dancer, NP ? ? ?

## 2021-08-20 ENCOUNTER — Encounter: Payer: Self-pay | Admitting: Nurse Practitioner

## 2021-08-20 ENCOUNTER — Ambulatory Visit (INDEPENDENT_AMBULATORY_CARE_PROVIDER_SITE_OTHER): Payer: Managed Care, Other (non HMO) | Admitting: Nurse Practitioner

## 2021-08-20 VITALS — BP 116/80 | HR 83 | Temp 97.1°F | Wt 184.8 lb

## 2021-08-20 DIAGNOSIS — R52 Pain, unspecified: Secondary | ICD-10-CM

## 2021-08-20 DIAGNOSIS — B351 Tinea unguium: Secondary | ICD-10-CM

## 2021-08-20 DIAGNOSIS — R1013 Epigastric pain: Secondary | ICD-10-CM | POA: Diagnosis not present

## 2021-08-20 LAB — POC COVID19 BINAXNOW: SARS Coronavirus 2 Ag: NEGATIVE

## 2021-08-20 MED ORDER — PANTOPRAZOLE SODIUM 40 MG PO TBEC
40.0000 mg | DELAYED_RELEASE_TABLET | Freq: Every day | ORAL | 1 refills | Status: DC
Start: 2021-08-20 — End: 2021-09-18

## 2021-08-20 NOTE — Progress Notes (Signed)
? ?Acute Office Visit ? ?Subjective:  ? ?  ?Patient ID: Theresa Hinton, female    DOB: [birth year], 46 y.o.   MRN: [id number] ? ?Chief Complaint  ?Patient presents with  ? GI Problem  ?  Pt c/o stomach pains, nausea, diarrhea, weakness, fatigue, and headaches w/ loss of appetite x4 days.   ? ? ?HPI ?Patient is in today for stomach pain with diarrhea since Sunday around 3am until Monday around 12pm. She is still having ongoing abdominal pain, headaches, and fatigue. She describes the pain in her upper abdomen. Whenever she eats, she feels a lot of bloating and gas. Denies nausea and vomiting. She is also feeling body aches and cold all over. She took tylenol and ibuprofen which both didn't help. The only thing she is able to eat and drink are oranges and water. She endorses an ongoing cough with some phlegm.  ? ?She has also been dealing with fungal infection on her left great toe for the past 2 years.  She states that she has been using apple cider vinegar at home and it is not helping.  She states that she keeps forgetting to bring this up at visits. ? ?ROS ?See pertinent positives and negatives per HPI. ? ?   ?Objective:  ?  ?BP 116/80   Pulse 83   Temp (!) 97.1 ?F (36.2 ?C) (Temporal)   Wt 184 lb 12.8 oz (83.8 kg)   SpO2 97%   BMI 33.80 kg/m?  ? ? ?Physical Exam ?Vitals and nursing note reviewed.  ?Constitutional:   ?   General: She is not in acute distress. ?   Appearance: Normal appearance.  ?HENT:  ?   Head: Normocephalic.  ?Eyes:  ?   Conjunctiva/sclera: Conjunctivae normal.  ?Cardiovascular:  ?   Rate and Rhythm: Normal rate and regular rhythm.  ?   Pulses: Normal pulses.  ?   Heart sounds: Normal heart sounds.  ?Pulmonary:  ?   Effort: Pulmonary effort is normal.  ?   Breath sounds: Normal breath sounds.  ?Abdominal:  ?   Tenderness: There is abdominal tenderness in the epigastric area. There is no guarding or rebound. Negative signs include Murphy's sign, Rovsing's sign and McBurney's sign.   ?   Hernia: No hernia is present.  ?Musculoskeletal:  ?   Cervical back: Normal range of motion.  ?Skin: ?   General: Skin is warm.  ?   Comments: Left great toenail slightly thickened, discolored, shorter in areas  ?Neurological:  ?   General: No focal deficit present.  ?   Mental Status: She is alert and oriented to person, place, and time.  ?Psychiatric:     ?   Mood and Affect: Mood normal.     ?   Behavior: Behavior normal.     ?   Thought Content: Thought content normal.     ?   Judgment: Judgment normal.  ? ? ?Results for orders placed or performed in visit on 08/20/21  ?POC COVID-19 BinaxNow  ?Result Value Ref Range  ? SARS Coronavirus 2 Ag Negative Negative  ? ? ? ?   ?Assessment & Plan:  ? ?Problem List Items Addressed This Visit   ? ?  ? Other  ? Epigastric pain - Primary  ?  She has been having epigastric pain, reflux, headaches, back pain for the last 3 days.  She states that she had diarrhea on Sunday and Monday.  Now when she eats she is noticing a lot  of of gas movement in her stomach.  She states that she has a bad burning taste in the back of her throat at times.  We will have her start Protonix 40 mg daily.  Also encouraged her to eat bland foods and limit the amount of spicy and fried foods for the time being.  We will check a CMP, CBC and also a right upper quadrant ultrasound to look for gallstones.  Discussed ER precautions.  Follow-up in 4 weeks. Work note given. ? ?  ?  ? Relevant Orders  ? CBC with Differential/Platelet  ? Comprehensive metabolic panel  ? US Abdomen Limited RUQ (LIVER/GB)  ? ?Other Visit Diagnoses   ? ? Body aches      ? With body aches, GI upset, feeling cold we will check COVID test.  Point-of-care COVID was negative.  ? Relevant Orders  ? POC COVID-19 BinaxNow (Completed)  ? Onychomycosis      ? Noted on left great toenail.  Will refer to podiatry for further treatment  ? Relevant Orders  ? Ambulatory referral to Podiatry  ? ?  ? ? ?Meds ordered this encounter   ?Medications  ? pantoprazole (PROTONIX) 40 MG tablet  ?  Sig: Take 1 tablet (40 mg total) by mouth daily.  ?  Dispense:  90 tablet  ?  Refill:  1  ? ? ?Return in about 4 weeks (around 09/17/2021) for abdominal. ? ?Charyl Dancer, NP ? ? ?

## 2021-08-20 NOTE — Assessment & Plan Note (Addendum)
She has been having epigastric pain, reflux, headaches, back pain for the last 3 days.  She states that she had diarrhea on Sunday and Monday.  Now when she eats she is noticing a lot of of gas movement in her stomach.  She states that she has a bad burning taste in the back of her throat at times.  We will have her start Protonix 40 mg daily.  Also encouraged her to eat bland foods and limit the amount of spicy and fried foods for the time being.  We will check a CMP, CBC and also a right upper quadrant ultrasound to look for gallstones.  Discussed ER precautions.  Follow-up in 4 weeks. Work note given. ?

## 2021-08-20 NOTE — Patient Instructions (Addendum)
It was great to see you! ? ?We are checking your labs today and will let you know the results via mychart/phone.  ?  ?We are also going ultrasound your stomach. Start protonix daily.  ? ?I am also placing a referral to podiatry. ? ?Let's follow-up in 4 weeks, sooner if you have concerns. ? ?If a referral was placed today, you will be contacted for an appointment. Please note that routine referrals can sometimes take up to 3-4 weeks to process. Please call our office if you haven't heard anything after this time frame. ? ?Take care, ? ?Vance Peper, NP ? ?

## 2021-08-21 LAB — CBC WITH DIFFERENTIAL/PLATELET
Basophils Absolute: 0.1 10*3/uL (ref 0.0–0.1)
Basophils Relative: 1.1 % (ref 0.0–3.0)
Eosinophils Absolute: 0.2 10*3/uL (ref 0.0–0.7)
Eosinophils Relative: 3.5 % (ref 0.0–5.0)
HCT: 39.3 % (ref 36.0–46.0)
Hemoglobin: 13.1 g/dL (ref 12.0–15.0)
Lymphocytes Relative: 36.3 % (ref 12.0–46.0)
Lymphs Abs: 2.3 10*3/uL (ref 0.7–4.0)
MCHC: 33.4 g/dL (ref 30.0–36.0)
MCV: 90.2 fl (ref 78.0–100.0)
Monocytes Absolute: 0.5 10*3/uL (ref 0.1–1.0)
Monocytes Relative: 7.8 % (ref 3.0–12.0)
Neutro Abs: 3.3 10*3/uL (ref 1.4–7.7)
Neutrophils Relative %: 51.3 % (ref 43.0–77.0)
Platelets: 227 10*3/uL (ref 150.0–400.0)
RBC: 4.35 Mil/uL (ref 3.87–5.11)
RDW: 13.9 % (ref 11.5–15.5)
WBC: 6.3 10*3/uL (ref 4.0–10.5)

## 2021-08-21 LAB — COMPREHENSIVE METABOLIC PANEL
ALT: 24 U/L (ref 0–35)
AST: 19 U/L (ref 0–37)
Albumin: 4.2 g/dL (ref 3.5–5.2)
Alkaline Phosphatase: 114 U/L (ref 39–117)
BUN: 17 mg/dL (ref 6–23)
CO2: 27 mEq/L (ref 19–32)
Calcium: 9.4 mg/dL (ref 8.4–10.5)
Chloride: 103 mEq/L (ref 96–112)
Creatinine, Ser: 0.78 mg/dL (ref 0.40–1.20)
GFR: 90.59 mL/min (ref >60.00)
Glucose, Bld: 92 mg/dL (ref 70–99)
Potassium: 4.3 mEq/L (ref 3.5–5.1)
Sodium: 137 mEq/L (ref 135–145)
Total Bilirubin: 0.2 mg/dL (ref 0.2–1.2)
Total Protein: 7 g/dL (ref 6.0–8.3)

## 2021-08-25 NOTE — Progress Notes (Signed)
Interpreter services was used during this call. Called and informed patient of results and provider instructions. Patient voiced understanding. Sw, cma

## 2021-08-26 ENCOUNTER — Ambulatory Visit
Admission: RE | Admit: 2021-08-26 | Discharge: 2021-08-26 | Disposition: A | Discharge status: Home / Self Care | Payer: Commercial Managed Care - HMO | Source: Ambulatory Visit | Attending: Nurse Practitioner | Admitting: Nurse Practitioner

## 2021-08-26 ENCOUNTER — Other Ambulatory Visit: Payer: Self-pay | Admitting: Nurse Practitioner

## 2021-08-26 DIAGNOSIS — R160 Hepatomegaly, not elsewhere classified: Secondary | ICD-10-CM

## 2021-08-26 DIAGNOSIS — R1013 Epigastric pain: Secondary | ICD-10-CM

## 2021-08-26 NOTE — Progress Notes (Signed)
Referral placed to GI for hepatomegaly noted on ultrasound and abdominal pain.  ?

## 2021-09-03 ENCOUNTER — Encounter: Payer: Self-pay | Admitting: Podiatry

## 2021-09-03 ENCOUNTER — Ambulatory Visit: Payer: Commercial Managed Care - HMO | Admitting: Podiatry

## 2021-09-03 DIAGNOSIS — B351 Tinea unguium: Secondary | ICD-10-CM | POA: Diagnosis not present

## 2021-09-03 MED ORDER — CICLOPIROX 8 % EX SOLN: Freq: Every day | CUTANEOUS | 0 refills | Status: DC

## 2021-09-03 NOTE — Addendum Note (Signed)
Addended by: Isidore Moos A on: 09/03/2021 11:50 AM   Modules accepted: Orders

## 2021-09-03 NOTE — Progress Notes (Signed)
  Subjective:  Patient ID: Theresa Hinton, female    DOB: 1975/11/11,   MRN: [id number]  Chief Complaint  Patient presents with   Nail Problem    Bilateral Onychomycosis    46 y.o. female presents for concern of bilateral great toenail fungus. Has been going on for about 2 years. Sometimes it hurts as the nails get thick. Relates she tries trimming but denies nay other treatments.  . Denies any other pedal complaints. Denies n/v/f/c.   Past Medical History:  Diagnosis Date   Hyperlipidemia    Sciatic nerve disease     Objective:  Physical Exam: Vascular: DP/PT pulses 2/4 bilateral. CFT <3 seconds. Normal hair growth on digits. No edema.  Skin. No lacerations or abrasions bilateral feet. Left hallux nail with discoloration thickness and subungual debris noted.  Musculoskeletal: MMT 5/5 bilateral lower extremities in DF, PF, Inversion and Eversion. Deceased ROM in DF of ankle joint.  Neurological: Sensation intact to light touch.   Assessment:   1. Onychomycosis      Plan:  Patient was evaluated and treated and all questions answered. -Examined patient -Discussed treatment options for painful dystrophic nails  -Clinical picture and Fungal culture was obtained by removing a portion of the hard nail itself from each of the involved toenails using a sterile nail nipper and sent to [facility] lab. Patient tolerated the biopsy procedure well without discomfort or need for anesthesia.  -Discussed fungal nail treatment options including oral, topical, and laser treatments.  -Penalc prescribed in meantime.  -Patient to return in 4 weeks for follow up evaluation and discussion of fungal culture results or sooner if symptoms worsen.   Lorenda Peck, DPM

## 2021-09-04 NOTE — Progress Notes (Signed)
Interpreter used during the phone call. Called and informed patient of results and provider comments. Pt voiced understanding. Sw, cma

## 2021-09-15 ENCOUNTER — Telehealth: Payer: Self-pay | Admitting: Family Medicine

## 2021-09-15 NOTE — Telephone Encounter (Signed)
Pt called and stated she never received a phone call from the specialists. Pt stated also in reference to her liver enlarged

## 2021-09-16 NOTE — Telephone Encounter (Signed)
Called patient gave her the number to [facility] [phone] to call and speak to them regarding appointment.  Dm/cma

## 2021-09-18 ENCOUNTER — Ambulatory Visit: Payer: Commercial Managed Care - HMO | Admitting: Gastroenterology

## 2021-09-18 ENCOUNTER — Encounter: Payer: Self-pay | Admitting: Gastroenterology

## 2021-09-18 VITALS — BP 150/104 | HR 88 | Ht 60.63 in | Wt 185.5 lb

## 2021-09-18 DIAGNOSIS — R197 Diarrhea, unspecified: Secondary | ICD-10-CM

## 2021-09-18 DIAGNOSIS — R160 Hepatomegaly, not elsewhere classified: Secondary | ICD-10-CM

## 2021-09-18 DIAGNOSIS — R1084 Generalized abdominal pain: Secondary | ICD-10-CM

## 2021-09-18 MED ORDER — NA SULFATE-K SULFATE-MG SULF 17.5-3.13-1.6 GM/177ML PO SOLN: 1.0000 | Freq: Once | ORAL | 0 refills | Status: AC

## 2021-09-18 MED ORDER — PANTOPRAZOLE SODIUM 40 MG PO TBEC
40.0000 mg | DELAYED_RELEASE_TABLET | Freq: Two times a day (BID) | ORAL | 1 refills | Status: DC
Start: 2021-09-18 — End: 2021-12-09

## 2021-09-18 NOTE — Progress Notes (Signed)
Referring Provider: Haydee Salter, MD Primary Care Physician:  Haydee Salter, MD  Reason for Consultation: Hepatomegaly   IMPRESSION:  Abdominal pain and diarrhea. Not explained by recent ultrasound. There may be some recent improvement. Given her frequent NSAID use we must consider PUD, as well as gastritis, esophagitis, H pylori, and celiac as well as  irritable bowel syndrome, IBD,  infection, food intolerance, microscopic colitis, thyroid disorder, other functional GI disease. By history, this is less likely to be obstruction. Endoscopic evaluation recommended. Empiric trial of PPI in the meantime.   Hepatomegaly. Unclear if hepatomegaly is related to recent symptoms or a separate process. Given the hepatomegaly and suspected fatty liver on imaging with normal liver enzymes I recommend evaluation for genotype 3 HCV, alpha-one-antitrypsin level, and ceruloplasmin level, insulin resistance and elastography.   Need for colon cancer screening    PLAN: - Fasting insulin, fasting glucose, hepatitis C antibody, alpha-one-antitrypsin level, and ceruloplasmin level  - Elastography - Increase pantoprazole to 40 mg BID - Avoid NSAIDs such at ibuprofen  - EGD - Colonoscopy    HPI: Theresa Hinton is a 46 y.o. female referred by Rolm Baptise, NP for further evaluation of hepatomegaly.  The history is obtained through the patient with the help of a Spanish interpreter and review of her electronic health record. 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   She was recently evaluated by her PCP for one week of severe abdominal pain, diarrhea, and headache. She felt like there was a ball in her upper abdomen. Associated back pain developed around that same time and persists. The diarrhea and headache has largely improved, but, she continues to have a right-sided abdominal pain.  Continues to have postprandial bowel movements.   She tries to follow a healthy diet with fresh  fruits and vegetable to treat constipation. She eats oatmeal with chia seeds to avoid straining. She has a bowel movement most days on this regimen.   Abdominal ultrasound to evaluate her recent symptoms showed an enlarged liver with diffuse coarse echogenicity.  There was no focal mass.  Dopplers were normal.  No gallstones seen.  Uses ibuprofen for sciatica and ankles. Not daily but frequently.   Labs 08/20/21 showed normal CMP and CBC  She had a colonoscopy many years ago in [place] for the evaluation of blood in the stool. Ultimately diagnosed with hemorrhoids.   There is no known family history of colon cancer or polyps. No family history of stomach cancer or other GI malignancy. No family history of inflammatory bowel disease or celiac.    Past Medical History:  Diagnosis Date   Hyperlipidemia    Sciatic nerve disease     Past Surgical History:  Procedure Laterality Date   CESAREAN SECTION     HARDWARE REMOVAL Left    ORIF ANKLE FRACTURE BIMALLEOLAR Left       Current Outpatient Medications  Medication Sig Dispense Refill   albuterol (VENTOLIN HFA) 108 (90 Base) MCG/ACT inhaler Inhale 2 puffs into the lungs every 6 (six) hours as needed for wheezing or shortness of breath. 8 g 0   benzonatate (TESSALON) 100 MG capsule Take 1 capsule (100 mg total) by mouth 2 (two) times daily as needed for cough. 20 capsule 0   chlorpheniramine-HYDROcodone (TUSSIONEX PENNKINETIC ER) 10-8 MG/5ML Take 5 mLs by mouth every 12 (twelve) hours as needed for cough. 70 mL 0   ciclopirox (PENLAC) 8 % solution Apply topically at bedtime. Apply over nail and  surrounding skin. Apply daily over previous coat. After seven (7) days, may remove with alcohol and continue cycle. 6.6 mL 0   HYDROcodone-acetaminophen (NORCO/VICODIN) 5-325 MG tablet Take 1 tablet by mouth every 6 (six) hours as needed for moderate pain. 20 tablet 0   ibuprofen (ADVIL) 800 MG tablet Take 1 tablet (800 mg total) by mouth every 8  (eight) hours as needed. 30 tablet 5   pantoprazole (PROTONIX) 40 MG tablet Take 1 tablet (40 mg total) by mouth daily. 90 tablet 1   No current facility-administered medications for this visit.    Allergies as of 09/18/2021   (No Known Allergies)    Family History  Problem Relation Age of Onset   Hyperlipidemia Mother    Hypertension Mother    Asthma Mother    Diabetes Father    Uterine cancer Maternal Grandmother     Social History   Socioeconomic History   Marital status: Single    Spouse name: Not on file   Number of children: 2   Years of education: Not on file   Highest education level: Not on file  Occupational History   Occupation: Housekeeping  Tobacco Use   Smoking status: Never   Smokeless tobacco: Never  Vaping Use   Vaping Use: Never used  Substance and Sexual Activity   Alcohol use: Yes    Comment: socially   Drug use: Never   Sexual activity: Yes  Other Topics Concern   Not on file  Social History Narrative   Not on file   Social Determinants of Health   Financial Resource Strain: Not on file  Food Insecurity: Not on file  Transportation Needs: Not on file  Physical Activity: Not on file  Stress: Not on file  Social Connections: Not on file  Intimate Partner Violence: Not on file    Review of Systems: 12 system ROS is negative except as noted above.   Physical Exam: General:   Alert,  well-nourished, pleasant and cooperative in NAD Head:  Normocephalic and atraumatic. Eyes:  Sclera clear, no icterus.   Conjunctiva pink. Ears:  Normal auditory acuity. Nose:  No deformity, discharge,  or lesions. Mouth:  No deformity or lesions.   Neck:  Supple; no masses or thyromegaly. Lungs:  Clear throughout to auscultation.   No wheezes. Heart:  Regular rate and rhythm; no murmurs. Abdomen:  Soft, mild upper abdominal discomfort, nondistended, normal bowel sounds, no rebound or guarding. No hepatosplenomegaly.   Rectal:  Deferred  Msk:   Symmetrical. No boney deformities LAD: No inguinal or umbilical LAD Extremities:  No clubbing or edema. Neurologic:  Alert and  oriented x4;  grossly nonfocal Skin:  Intact without significant lesions or rashes. Psych:  Alert and cooperative. Normal mood and affect.    Winfrey Chillemi L. Tarri Glenn, MD, MPH 09/18/2021, 3:51 PM

## 2021-09-18 NOTE — Patient Instructions (Signed)
Fue un placer brindarle atencin hoy. Con base en nuestra discusin, les ofrezco mis recomendaciones a continuacin:  RECOMENDACIN(ES):  MEDICAMENTOS RECETADOS):  Hemos enviado los siguientes medicamentos a su farmacia:   Aumente Pantoprazol a 40 mg dos veces al da 30-60 minutos antes del desayuno y Secondary school teacher.  LABORATORIOS:   Dirjase al nivel del stano para el trabajo de laboratorio antes de salir hoy. Presione "B" en el ascensor. El laboratorio est ubicado en la primera puerta a la izquierda al salir del Materials engineer.  ULTRASONIDO ABDOMINAL:   Ha sido programado para Theresa Hinton ecografa abdominal en [facility] (primer piso del hospital) [facility] 20/06/23 a las 9 am. Por favor llegue a las 8:30 am para Engineer, technical sales.  DEBERES:   NO coma ni beba nada 6 horas antes de su prueba.  NECESITAS REPROGRAMAR?   Llame a radiologa al [phone].  HACER UN SEGUIMIENTO:   Despus de su procedimiento, recibir Product manager del personal de mi oficina con mi recomendacin para el seguimiento.  IMC:   Si tiene 55 aos o menos, su ndice de [employer] corporal debe estar entre 36 y 38. Su ndice de masa corporal es de 35,48 kg/m. Si esto est fuera del rango mencionado anteriormente, considere hacer un seguimiento con su proveedor de Midwife.  MI CARTA:  Los proveedores de Financial controller GI desean alentarlo a que use MYCHART para comunicarse con los proveedores para solicitudes o preguntas que no sean urgentes. Debido a los Astronomer de espera en el telfono, enviar un mensaje a su proveedor por [employer] puede ser una forma ms rpida y eficiente de obtener una respuesta. Espere 48 horas hbiles para obtener [employer]. Recuerde que esto es para solicitudes no urgentes.  Gracias por confiar en m para su cuidado gastrointestinal!  Thornton Park, MD, MPH  It was my pleasure to provide care to you today. Based on our discussion, I am providing you with my recommendations  below:  RECOMMENDATION(S):   PRESCRIPTION MEDICATION(S):   We have sent the following medication(s) to your pharmacy:  Increase Pantoprazole to 40 mg twice daily 30-60 minutes before breakfast and dinner.  LABS:   Please proceed to the basement level for lab work before leaving today. Press "B" on the elevator. The lab is located at the first door on the left as you exit the elevator.  ABDOMINAL ULTRASOUND:  You have been scheduled for an abdominal ultrasound at [facility] (1st floor of the hospital) on Tuesday 09/30/21 at 47 am. Please arrive @ 8:30 am for registration.   PREP:   Please DO NOT eat or drink anything 6 hours prior to your test.  NEED TO RESCHEDULE?   Please call radiology at [phone].  FOLLOW UP:  After your procedure, you will receive a call from my office staff regarding my recommendation for follow up.  BMI:  If you are age 47 or younger, your body mass index should be between 19-25. Your Body mass index is 35.48 kg/m. If this is out of the aformentioned range listed, please consider follow up with your Primary Care Provider.   MY CHART:  The [facility] providers would like to encourage you to use [facility] to communicate with providers for non-urgent requests or questions.  Due to long hold times on the telephone, sending your provider a message by [place] may be a faster and more efficient way to get a response.  Please allow 48 business hours for a response.  Please remember that this is for  non-urgent requests.   Thank you for trusting me with your gastrointestinal care!    Thornton Park, MD, MPH

## 2021-09-22 ENCOUNTER — Other Ambulatory Visit (INDEPENDENT_AMBULATORY_CARE_PROVIDER_SITE_OTHER): Payer: Commercial Managed Care - HMO

## 2021-09-22 DIAGNOSIS — R160 Hepatomegaly, not elsewhere classified: Secondary | ICD-10-CM

## 2021-09-22 LAB — GLUCOSE, RANDOM: Glucose, Bld: 136 mg/dL — ABNORMAL HIGH (ref 70–99)

## 2021-09-23 LAB — CERULOPLASMIN: Ceruloplasmin: 27 mg/dL (ref 18–53)

## 2021-09-23 LAB — HEPATITIS C ANTIBODY
Hepatitis C Ab: NONREACTIVE
SIGNAL TO CUT-OFF: 0.08 (ref <1.00)

## 2021-09-23 LAB — ALPHA-1-ANTITRYPSIN: A-1 Antitrypsin, Ser: 83 mg/dL (ref 83–199)

## 2021-09-27 LAB — INSULIN, FREE AND TOTAL
Free Insulin: 17 uU/mL
Total Insulin: 18 uU/mL

## 2021-09-30 ENCOUNTER — Ambulatory Visit (HOSPITAL_COMMUNITY)
Admission: RE | Admit: 2021-09-30 | Discharge: 2021-09-30 | Disposition: A | Discharge status: Home / Self Care | Payer: Commercial Managed Care - HMO | Source: Ambulatory Visit | Attending: Gastroenterology | Admitting: Gastroenterology

## 2021-09-30 DIAGNOSIS — R160 Hepatomegaly, not elsewhere classified: Secondary | ICD-10-CM | POA: Insufficient documentation

## 2021-10-08 ENCOUNTER — Encounter: Payer: Self-pay | Admitting: Podiatry

## 2021-10-08 ENCOUNTER — Ambulatory Visit (INDEPENDENT_AMBULATORY_CARE_PROVIDER_SITE_OTHER): Payer: Commercial Managed Care - HMO | Admitting: Podiatry

## 2021-10-08 DIAGNOSIS — B351 Tinea unguium: Secondary | ICD-10-CM

## 2021-10-08 MED ORDER — CICLOPIROX 8 % EX SOLN: Freq: Every day | CUTANEOUS | 0 refills | Status: AC

## 2021-10-08 NOTE — Progress Notes (Signed)
  Subjective:  Patient ID: Theresa Hinton, female    DOB: 1975-09-07,   MRN: [id number]  No chief complaint on file.   46 y.o. female presents for concern of bilateral great toenail fungus. Here to review results. Relates the penlac has been helping and noticing improvement. .  . Denies any other pedal complaints. Denies n/v/f/c.   Past Medical History:  Diagnosis Date   Hyperlipidemia    Sciatic nerve disease     Objective:  Physical Exam: Vascular: DP/PT pulses 2/4 bilateral. CFT <3 seconds. Normal hair growth on digits. No edema.  Skin. No lacerations or abrasions bilateral feet. Left hallux nail with discoloration thickness and subungual debris noted proximal nail fold appears more healthy.  Musculoskeletal: MMT 5/5 bilateral lower extremities in DF, PF, Inversion and Eversion. Deceased ROM in DF of ankle joint.  Neurological: Sensation intact to light touch.   Assessment:   1. Onychomycosis   2. Nail fungus      Plan:  Patient was evaluated and treated and all questions answered. -Examined patient -Discussed treatment options for painful dystrophic nails  -Awaiting culture results will call when they come in  -Discussed fungal nail treatment options including oral, topical, and laser treatments.  -Continue penlac as seems to be helping. Refill provided.  -Patient to return as needed.    Lorenda Peck, DPM

## 2021-11-23 ENCOUNTER — Encounter: Payer: Self-pay | Admitting: Certified Registered Nurse Anesthetist

## 2021-12-01 ENCOUNTER — Encounter: Payer: Self-pay | Admitting: Gastroenterology

## 2021-12-01 ENCOUNTER — Ambulatory Visit (AMBULATORY_SURGERY_CENTER): Payer: Commercial Managed Care - HMO | Admitting: Gastroenterology

## 2021-12-01 VITALS — BP 120/74 | HR 76 | Temp 98.4°F | Resp 16 | Ht 60.0 in | Wt 185.0 lb

## 2021-12-01 DIAGNOSIS — Z1211 Encounter for screening for malignant neoplasm of colon: Secondary | ICD-10-CM

## 2021-12-01 DIAGNOSIS — B9681 Helicobacter pylori [H. pylori] as the cause of diseases classified elsewhere: Secondary | ICD-10-CM | POA: Diagnosis not present

## 2021-12-01 DIAGNOSIS — K5289 Other specified noninfective gastroenteritis and colitis: Secondary | ICD-10-CM | POA: Diagnosis not present

## 2021-12-01 DIAGNOSIS — K2100 Gastro-esophageal reflux disease with esophagitis, without bleeding: Secondary | ICD-10-CM | POA: Diagnosis not present

## 2021-12-01 DIAGNOSIS — K2950 Unspecified chronic gastritis without bleeding: Secondary | ICD-10-CM | POA: Diagnosis not present

## 2021-12-01 DIAGNOSIS — K259 Gastric ulcer, unspecified as acute or chronic, without hemorrhage or perforation: Secondary | ICD-10-CM

## 2021-12-01 DIAGNOSIS — K2970 Gastritis, unspecified, without bleeding: Secondary | ICD-10-CM

## 2021-12-01 DIAGNOSIS — R160 Hepatomegaly, not elsewhere classified: Secondary | ICD-10-CM

## 2021-12-01 DIAGNOSIS — R197 Diarrhea, unspecified: Secondary | ICD-10-CM

## 2021-12-01 MED ORDER — SODIUM CHLORIDE 0.9 % IV SOLN: 500.0000 mL | Freq: Once | INTRAVENOUS | Status: DC

## 2021-12-01 NOTE — Progress Notes (Signed)
Referring Provider: Haydee Salter, MD Primary Care Physician:  Haydee Salter, MD  Indication for EGD: Abdominal pain, diarrhea  indication for Colonoscopy:  Colon cancer screening   IMPRESSION:  Abdominal pain Diarrhea Need for colon cancer screening Appropriate candidate for monitored anesthesia care  PLAN: EGD and Colonoscopy in the [facility] today   HPI: Theresa Hinton is a 46 y.o. female presents for screening colonoscopy and upper endoscopy to evaluate abdominal pain and diarrhea Rea.  She was recently evaluated by her PCP for one week of severe abdominal pain, diarrhea, and headache. She felt like there was a ball in her upper abdomen. Associated back pain developed around that same time and persists. The diarrhea and headache has largely improved, but, she continues to have a right-sided abdominal pain.  Continues to have postprandial bowel movements.    She tries to follow a healthy diet with fresh fruits and vegetable to treat constipation. She eats oatmeal with chia seeds to avoid straining. She has a bowel movement most days on this regimen.    Abdominal ultrasound to evaluate her recent symptoms showed an enlarged liver with diffuse coarse echogenicity.  There was no focal mass.  Dopplers were normal.  No gallstones seen.   Uses ibuprofen for sciatica and ankles. Not daily but frequently.    Labs 08/20/21 showed normal CMP and CBC   She had a colonoscopy many years ago in [place] for the evaluation of blood in the stool. Ultimately diagnosed with hemorrhoids.    There is no known family history of colon cancer or polyps. No family history of stomach cancer or other GI malignancy. No family history of inflammatory bowel disease or celiac.    Past Medical History:  Diagnosis Date   Hyperlipidemia    Sciatic nerve disease     Past Surgical History:  Procedure Laterality Date   CESAREAN SECTION     HARDWARE REMOVAL Left    ORIF ANKLE FRACTURE  BIMALLEOLAR Left     Current Outpatient Medications  Medication Sig Dispense Refill   albuterol (VENTOLIN HFA) 108 (90 Base) MCG/ACT inhaler Inhale 2 puffs into the lungs every 6 (six) hours as needed for wheezing or shortness of breath. 8 g 0   benzonatate (TESSALON) 100 MG capsule Take 1 capsule (100 mg total) by mouth 2 (two) times daily as needed for cough. (Patient not taking: Reported on 12/01/2021) 20 capsule 0   chlorpheniramine-HYDROcodone (TUSSIONEX PENNKINETIC ER) 10-8 MG/5ML Take 5 mLs by mouth every 12 (twelve) hours as needed for cough. (Patient not taking: Reported on 12/01/2021) 70 mL 0   ciclopirox (PENLAC) 8 % solution Apply topically at bedtime. Apply over nail and surrounding skin. Apply daily over previous coat. After seven (7) days, may remove with alcohol and continue cycle. 6.6 mL 0   HYDROcodone-acetaminophen (NORCO/VICODIN) 5-325 MG tablet Take 1 tablet by mouth every 6 (six) hours as needed for moderate pain. (Patient not taking: Reported on 12/01/2021) 20 tablet 0   ibuprofen (ADVIL) 800 MG tablet Take 1 tablet (800 mg total) by mouth every 8 (eight) hours as needed. 30 tablet 5   pantoprazole (PROTONIX) 40 MG tablet Take 1 tablet (40 mg total) by mouth 2 (two) times daily before a meal. 180 tablet 1   Current Facility-Administered Medications  Medication Dose Route Frequency Provider Last Rate Last Admin   0.9 %  sodium chloride infusion  500 mL Intravenous Once Thornton Park, MD        Allergies as  of 12/01/2021   (No Known Allergies)    Family History  Problem Relation Age of Onset   Hyperlipidemia Mother    Hypertension Mother    Asthma Mother    Diabetes Father    Uterine cancer Maternal Grandmother    Colon cancer Neg Hx    Esophageal cancer Neg Hx    Rectal cancer Neg Hx      Physical Exam: General:   Alert,  well-nourished, pleasant and cooperative in NAD Head:  Normocephalic and atraumatic. Eyes:  Sclera clear, no icterus.   Conjunctiva  pink. Mouth:  No deformity or lesions.   Neck:  Supple; no masses or thyromegaly. Lungs:  Clear throughout to auscultation.   No wheezes. Heart:  Regular rate and rhythm; no murmurs. Abdomen:  Soft, non-tender, nondistended, normal bowel sounds, no rebound or guarding.  Msk:  Symmetrical. No boney deformities LAD: No inguinal or umbilical LAD Extremities:  No clubbing or edema. Neurologic:  Alert and  oriented x4;  grossly nonfocal Skin:  No obvious rash or bruise. Psych:  Alert and cooperative. Normal mood and affect.     Studies/Results: No results found.    Melvin Whiteford L. Tarri Glenn, MD, MPH 12/01/2021, 11:02 AM

## 2021-12-01 NOTE — Progress Notes (Signed)
Pt's states no medical or surgical changes since previsit or office visit. 

## 2021-12-01 NOTE — Progress Notes (Signed)
Called to room to assist during endoscopic procedure.  Patient ID and intended procedure confirmed with present staff. Received instructions for my participation in the procedure from the performing physician.  

## 2021-12-01 NOTE — Op Note (Signed)
[place] Patient Name: Theresa Hinton Procedure Date: 12/01/2021 11:06 AM MRN: [id number] Endoscopist: Thornton Park MD, MD Age: 46 Referring MD:  Date of Birth: 1975/04/15 Gender: Female Account #: [id number] Procedure:                Upper GI endoscopy Indications:              Abdominal pain, Diarrhea (primarily post-prandial) Medicines:                Monitored Anesthesia Care Procedure:                Pre-Anesthesia Assessment:                           - Prior to the procedure, a History and Physical                            was performed, and patient medications and                            allergies were reviewed. The patient's tolerance of                            previous anesthesia was also reviewed. The risks                            and benefits of the procedure and the sedation                            options and risks were discussed with the patient.                            All questions were answered, and informed consent                            was obtained. Prior Anticoagulants: The patient has                            taken no previous anticoagulant or antiplatelet                            agents. ASA Grade Assessment: II - A patient with                            mild systemic disease. After reviewing the risks                            and benefits, the patient was deemed in                            satisfactory condition to undergo the procedure.                           After obtaining informed consent, the endoscope was  passed under direct vision. Throughout the                            procedure, the patient's blood pressure, pulse, and                            oxygen saturations were monitored continuously. The                            GIF [id number] #[id number] was introduced through the                            mouth, and advanced to the third part of duodenum.                             The upper GI endoscopy was accomplished without                            difficulty. The patient tolerated the procedure                            well. Scope In: Scope Out: Findings:                 LA Grade A (one or more mucosal breaks less than 5                            mm, not extending between tops of 2 mucosal folds)                            esophagitis with no bleeding was found 35 cm from                            the incisors.                           Diffuse moderate inflammation characterized by                            erythema, friability and granularity was found in                            the gastric fundus and in the gastric body.                            Biopsies were taken from the antrum, body, and                            fundus with a cold forceps for histology. Estimated                            blood loss was minimal.                           Multiple  small erosions with no bleeding and no                            stigmata of recent bleeding were found in the                            gastric antrum. Biopsies were taken with a cold                            forceps for histology. Estimated blood loss was                            minimal.                           The examined duodenum was normal. Biopsies were                            taken with a cold forceps for histology. Estimated                            blood loss was minimal. Complications:            No immediate complications. Estimated Blood Loss:     Estimated blood loss was minimal. 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                           - Gastritis. Biopsied.                           - Erosive gastropathy with no bleeding and no                            stigmata of recent bleeding. Biopsied.                           - Normal examined duodenum. Biopsied. Recommendation:           - Patient has a contact number  available for                            emergencies. The signs and symptoms of potential                            delayed complications were discussed with the                            patient. Return to normal activities tomorrow.                            Written discharge instructions were provided to the                            patient.                           -  Resume previous diet.                           - Reflux lifestyle modifications.                           - Continue present medications including                            pantoprazole 40 mg BID.                           - Await pathology results.                           - No aspirin, ibuprofen, naproxen, or other                            non-steroidal anti-inflammatory drugs. Thornton Park MD, MD 12/01/2021 11:36:34 AM This report has been signed electronically.

## 2021-12-01 NOTE — Op Note (Signed)
[facility] Patient Name: Theresa Hinton Procedure Date: 12/01/2021 11:05 AM MRN: [id number] Endoscopist: Thornton Park MD, MD Age: 46 Referring MD:  Date of Birth: 04-18-1975 Gender: Female Account #: [id number] Procedure:                Colonoscopy Indications:              Screening for colorectal malignant neoplasm Medicines:                Monitored Anesthesia Care Procedure:                Pre-Anesthesia Assessment:                           - Prior to the procedure, a History and Physical                            was performed, and patient medications and                            allergies were reviewed. The patient's tolerance of                            previous anesthesia was also reviewed. The risks                            and benefits of the procedure and the sedation                            options and risks were discussed with the patient.                            All questions were answered, and informed consent                            was obtained. Prior Anticoagulants: The patient has                            taken no previous anticoagulant or antiplatelet                            agents. ASA Grade Assessment: II - A patient with                            mild systemic disease. After reviewing the risks                            and benefits, the patient was deemed in                            satisfactory condition to undergo the procedure.                           After obtaining informed consent, the colonoscope  was passed under direct vision. Throughout the                            procedure, the patient's blood pressure, pulse, and                            oxygen saturations were monitored continuously. The                            Colonoscope was introduced through the anus and                            advanced to the 5 cm into the ileum. The                             colonoscopy was performed with moderate difficulty                            due to poor bowel prep. The patient tolerated the                            procedure well. The quality of the bowel                            preparation was poor. The terminal ileum, ileocecal                            valve, appendiceal orifice, and rectum were                            photographed. Scope In: 11:23:59 AM Scope Out: 11:31:33 AM Scope Withdrawal Time: 0 hours 5 minutes 19 seconds  Total Procedure Duration: 0 hours 7 minutes 34 seconds  Findings:                 The perianal and digital rectal examinations were                            normal.                           A moderate amount of liquid semi-liquid semi-solid                            stool was found in the entire colon, interfering                            with visualization.                           There was limited evaluation of the colonic mucosa.                            However, the mucosa appeared normal where seen.  Biopsies were taken with a cold forceps for                            histology. Estimated blood loss was minimal.                           The terminal ileum appeared normal. Biopsies were                            taken with a cold forceps for histology. Estimated                            blood loss was minimal.                           The exam was otherwise without abnormality on                            direct and retroflexion views. Complications:            No immediate complications. Estimated Blood Loss:     Estimated blood loss was minimal. Impression:               - Preparation of the colon was poor. Most of the                            colon was not visualized today. Small, medium                            sized, and flat polyps may not have been seen.                           - The colonic mucosa that was seen appeared normal.                             Biopsied.                           - The examined portion of the ileum was normal.                            Biopsied. Recommendation:           - Patient has a contact number available for                            emergencies. The signs and symptoms of potential                            delayed complications were discussed with the                            patient. Return to normal activities tomorrow.                            Written  discharge instructions were provided to the                            patient.                           - Resume previous diet.                           - Continue present medications.                           - Await pathology results.                           - Repeat colonoscopy at the next available                            appointment because the bowel preparation was poor.                            Use a two day bowel prep at that time.                           - Emerging evidence supports eating a diet of                            fruits, vegetables, grains, calcium, and yogurt                            while reducing red meat and alcohol may reduce the                            risk of colon cancer. Thornton Park MD, MD 12/01/2021 11:40:43 AM This report has been signed electronically.

## 2021-12-01 NOTE — Progress Notes (Signed)
Report given to PACU, vss 

## 2021-12-01 NOTE — Patient Instructions (Addendum)
Handout given for gastritis   No NSAIDS (Non-Steroidal anti-inflammatory drugs) for 3 weeks.  (These include, aspirin, aspirin-containing products, ibuprofen, advil, motrin, naproxen, aleve, goody powders, etc) Tylenol is ok to take as needed, see label for instructions.   Repeat colonoscopy due to poor prep, need 2 day prep next colonoscopy.  Previsit scheduled on with nurse Monday September 18 at 4 pm.  Please arrive by 345 pm.   USTED TUVO UN PROCEDIMIENTO ENDOSCPICO HOY EN EL [facility]:   Lea el informe del procedimiento que se le entreg para cualquier pregunta especfica sobre lo que se Primary school teacher.  Si el informe del examen no responde a sus preguntas, por favor llame a su gastroenterlogo para aclararlo.  Si usted solicit que no se le den [employer] de lo que se Estate manager/land agent en su procedimiento al [employer] va a cuidar, entonces el informe del procedimiento se ha incluido en un sobre sellado para que usted lo revise despus cuando le sea ms conveniente.   LO QUE PUEDE ESPERAR: Algunas sensaciones de hinchazn en el abdomen.  Puede tener ms gases de lo normal.  El caminar puede ayudarle a eliminar el aire que se le puso en el tracto gastrointestinal durante el procedimiento y reducir la hinchazn.  Si le hicieron una endoscopia inferior (como una colonoscopia o una sigmoidoscopia flexible), podra notar manchas de sangre en las heces fecales o en el papel higinico.  Si se someti a una preparacin intestinal para su procedimiento, es posible que no tenga una evacuacin intestinal normal durante [employer].   Tenga en cuenta:  Es posible que note un poco de irritacin y congestin en la nariz o algn drenaje.  Esto es debido al oxgeno [employer] durante su procedimiento.  No hay que preocuparse y esto debe desaparecer ms o Scientist, research (medical).   SNTOMAS PARA REPORTAR INMEDIATAMENTE:  Despus de una endoscopia inferior (colonoscopia o sigmoidoscopia  flexible):  Cantidades excesivas de sangre en las heces fecales  Sensibilidad significativa o empeoramiento de los dolores abdominales   Hinchazn aguda del abdomen que antes no tena   Despus de la endoscopia superior (EGD)  Vmitos de Biochemist, clinical o material como caf molido   Dolor en el pecho o dolor debajo de los omplatos que antes no tena   Dolor o dificultad persistente para tragar  Falta de aire que antes no tena   Fiebre de 100F o ms  Heces fecales negras y pegajosas   Para asuntos urgentes o de Freight forwarder, puede comunicarse con un gastroenterlogo a cualquier hora llamando al [phone].  DIETA:  Recomendamos una comida pequea al principio, pero luego puede continuar con su dieta normal.  Tome muchos lquidos, Teacher, adult education las bebidas alcohlicas durante 24 horas.    ACTIVIDAD:  Debe planear tomarse las cosas con calma por el resto del da y no debe CONDUCIR ni usar maquinaria pesada Programmer, applications (debido a los medicamentos de sedacin utilizados durante el examen).     SEGUIMIENTO: Nuestro personal llamar al nmero que aparece en su historial al siguiente da hbil de su procedimiento para ver cmo se siente y para responder cualquier pregunta o inquietud que pueda tener con respecto a la informacin que se le dio despus del procedimiento. Si no podemos contactarle, le dejaremos un mensaje.  Sin embargo, si se siente bien y no tiene Paediatric nurse, no es necesario que nos devuelva la llamada.  Asumiremos que ha regresado a sus actividades diarias normales sin incidentes.  Si se le tomaron algunas biopsias, le contactaremos por telfono o por carta en las prximas 3 semanas.  Si no ha sabido [employer] biopsias en el transcurso de 3 semanas, por favor llmenos al [phone].   FIRMAS/CONFIDENCIALIDAD: Usted y/o el acompaante que le cuide han firmado documentos que se ingresarn en su historial mdico electrnico.  Estas firmas atestiguan el hecho de que la  informacin anterior

## 2021-12-02 ENCOUNTER — Telehealth: Payer: Self-pay | Admitting: *Deleted

## 2021-12-02 NOTE — Telephone Encounter (Signed)
  Follow up Call-     12/01/2021   10:05 AM  Call back number  Post procedure Call Back phone  # [phone]  Permission to leave phone message Yes     Patient questions:  Do you have a fever, pain , or abdominal swelling? No. Pain Score  0 *  Have you tolerated food without any problems? Yes.    Have you been able to return to your normal activities? Yes.    Do you have any questions about your discharge instructions: Diet   No. Medications  No. Follow up visit  No.  Do you have questions or concerns about your Care? No.  Actions: * If pain score is 4 or above: No action needed, pain <4.

## 2021-12-05 ENCOUNTER — Other Ambulatory Visit: Payer: Self-pay

## 2021-12-05 MED ORDER — METRONIDAZOLE 500 MG PO TABS: 500.0000 mg | ORAL_TABLET | Freq: Four times a day (QID) | ORAL | 0 refills | Status: AC

## 2021-12-05 MED ORDER — TETRACYCLINE HCL 500 MG PO CAPS: 500.0000 mg | ORAL_CAPSULE | Freq: Four times a day (QID) | ORAL | 0 refills | Status: AC

## 2021-12-05 MED ORDER — BISMUTH SUBSALICYLATE 262 MG PO CHEW
262.0000 mg | CHEWABLE_TABLET | Freq: Four times a day (QID) | ORAL | 0 refills | Status: AC
Start: 2021-12-05 — End: 2021-12-19

## 2021-12-08 ENCOUNTER — Other Ambulatory Visit: Payer: Self-pay

## 2021-12-08 DIAGNOSIS — A048 Other specified bacterial intestinal infections: Secondary | ICD-10-CM

## 2021-12-09 ENCOUNTER — Other Ambulatory Visit: Payer: Self-pay

## 2021-12-09 MED ORDER — PANTOPRAZOLE SODIUM 40 MG PO TBEC: 40.0000 mg | DELAYED_RELEASE_TABLET | Freq: Two times a day (BID) | ORAL | 1 refills | Status: AC

## 2021-12-17 ENCOUNTER — Telehealth: Payer: Self-pay | Admitting: Pharmacy Technician

## 2021-12-17 ENCOUNTER — Other Ambulatory Visit (HOSPITAL_COMMUNITY): Payer: Self-pay

## 2021-12-17 NOTE — Telephone Encounter (Signed)
Patient Advocate Encounter  Received notification That prior authorization for PANTOPRAZOLE '40MG'$  is required.   PA submitted on 9.6.23 Key [id number] Status is pending    Luciano Cutter, CPhT Patient Advocate Phone: [phone]

## 2021-12-17 NOTE — Telephone Encounter (Signed)
Patient Advocate Encounter  Prior Authorization for Pantoprazole '40MG'$  has been approved.   Effective: 12/17/2021 to 12/17/2022.  Clista Bernhardt, CPhT Rx Patient Advocate Phone: [phone]

## 2021-12-29 ENCOUNTER — Ambulatory Visit (AMBULATORY_SURGERY_CENTER): Payer: Commercial Managed Care - HMO | Admitting: *Deleted

## 2021-12-29 ENCOUNTER — Encounter: Payer: Self-pay | Admitting: Gastroenterology

## 2021-12-29 VITALS — Ht 60.0 in | Wt 180.4 lb

## 2021-12-29 DIAGNOSIS — R197 Diarrhea, unspecified: Secondary | ICD-10-CM

## 2021-12-29 MED ORDER — NA SULFATE-K SULFATE-MG SULF 17.5-3.13-1.6 GM/177ML PO SOLN: 1.0000 | Freq: Once | ORAL | 0 refills | Status: AC

## 2021-12-29 NOTE — Progress Notes (Signed)
Interpreter used today at the [facility] for this pt.  Interpreter's name is- Alleen Borne  No egg or soy allergy known to patient  No issues known to pt with past sedation with any surgeries or procedures Patient denies ever being told they had issues or difficulty with intubation  No FH of Malignant Hyperthermia Pt is not on diet pills Pt is not on  home 02  Pt is not on blood thinners  2 day Suprep/Miralax given per DO Pt encouraged to use to use Singlecare or Goodrx to reduce cost

## 2022-01-01 ENCOUNTER — Encounter: Payer: Self-pay | Admitting: Certified Registered Nurse Anesthetist

## 2022-01-08 ENCOUNTER — Encounter: Payer: Self-pay | Admitting: Gastroenterology

## 2022-01-08 ENCOUNTER — Ambulatory Visit (AMBULATORY_SURGERY_CENTER): Payer: Commercial Managed Care - HMO | Admitting: Gastroenterology

## 2022-01-08 VITALS — BP 119/89 | HR 74 | Temp 98.2°F | Resp 16 | Ht 60.0 in | Wt 180.0 lb

## 2022-01-08 DIAGNOSIS — R197 Diarrhea, unspecified: Secondary | ICD-10-CM

## 2022-01-08 DIAGNOSIS — D120 Benign neoplasm of cecum: Secondary | ICD-10-CM

## 2022-01-08 DIAGNOSIS — D125 Benign neoplasm of sigmoid colon: Secondary | ICD-10-CM | POA: Diagnosis not present

## 2022-01-08 MED ORDER — SODIUM CHLORIDE 0.9 % IV SOLN: 500.0000 mL | Freq: Once | INTRAVENOUS | Status: DC

## 2022-01-08 NOTE — Progress Notes (Signed)
Referring Provider: Haydee Salter, MD Primary Care Physician:  Haydee Salter, MD   Indication for Colonoscopy:  Colon cancer screening   IMPRESSION:  Need for colon cancer screening Appropriate candidate for monitored anesthesia care  PLAN: Colonoscopy in the [facility] today   HPI: Theresa Hinton is a 46 y.o. female presents for screening colonoscopy and recent abdominal pain.  She had a colonoscopy many years ago in [place] for the evaluation of blood in the stool. Ultimately diagnosed with hemorrhoids.   She had an incomplete colonoscopy 12/01/2021 due to retained stool.  No known family history of colon cancer or polyps. No family history of uterine/endometrial cancer, pancreatic cancer or gastric/stomach cancer.   Past Medical History:  Diagnosis Date   Allergy    GERD (gastroesophageal reflux disease)    Hyperlipidemia    Sciatic nerve disease     Past Surgical History:  Procedure Laterality Date   CESAREAN SECTION     COLONOSCOPY     HARDWARE REMOVAL Left    ORIF ANKLE FRACTURE BIMALLEOLAR Left    UPPER GASTROINTESTINAL ENDOSCOPY      Current Outpatient Medications  Medication Sig Dispense Refill   albuterol (VENTOLIN HFA) 108 (90 Base) MCG/ACT inhaler Inhale 2 puffs into the lungs every 6 (six) hours as needed for wheezing or shortness of breath. (Patient not taking: Reported on 12/29/2021) 8 g 0   benzonatate (TESSALON) 100 MG capsule Take 1 capsule (100 mg total) by mouth 2 (two) times daily as needed for cough. 20 capsule 0   chlorpheniramine-HYDROcodone (TUSSIONEX PENNKINETIC ER) 10-8 MG/5ML Take 5 mLs by mouth every 12 (twelve) hours as needed for cough. 70 mL 0   ciclopirox (PENLAC) 8 % solution Apply topically at bedtime. Apply over nail and surrounding skin. Apply daily over previous coat. After seven (7) days, may remove with alcohol and continue cycle. 6.6 mL 0   HYDROcodone-acetaminophen (NORCO/VICODIN) 5-325 MG tablet Take 1 tablet by  mouth every 6 (six) hours as needed for moderate pain. 20 tablet 0   ibuprofen (ADVIL) 800 MG tablet Take 1 tablet (800 mg total) by mouth every 8 (eight) hours as needed. (Patient not taking: Reported on 12/29/2021) 30 tablet 5   pantoprazole (PROTONIX) 40 MG tablet Take 1 tablet (40 mg total) by mouth 2 (two) times daily before a meal. (Patient not taking: Reported on 12/29/2021) 180 tablet 1   Current Facility-Administered Medications  Medication Dose Route Frequency Provider Last Rate Last Admin   0.9 %  sodium chloride infusion  500 mL Intravenous Once Thornton Park, MD        Allergies as of 01/08/2022   (No Known Allergies)    Family History  Problem Relation Age of Onset   Hyperlipidemia Mother    Hypertension Mother    Asthma Mother    Diabetes Father    Uterine cancer Maternal Grandmother        cervical cancer   Colon cancer Neg Hx    Esophageal cancer Neg Hx    Rectal cancer Neg Hx    Stomach cancer Neg Hx      Physical Exam: General:   Alert,  well-nourished, pleasant and cooperative in NAD Head:  Normocephalic and atraumatic. Eyes:  Sclera clear, no icterus.   Conjunctiva pink. Mouth:  No deformity or lesions.   Neck:  Supple; no masses or thyromegaly. Lungs:  Clear throughout to auscultation.   No wheezes. Heart:  Regular rate and rhythm; no murmurs. Abdomen:  Soft,  non-tender, nondistended, normal bowel sounds, no rebound or guarding.  Msk:  Symmetrical. No boney deformities LAD: No inguinal or umbilical LAD Extremities:  No clubbing or edema. Neurologic:  Alert and  oriented x4;  grossly nonfocal Skin:  No obvious rash or bruise. Psych:  Alert and cooperative. Normal mood and affect.     Studies/Results: No results found.    Zuhayr Deeney L. Tarri Glenn, MD, MPH 01/08/2022, 9:48 AM

## 2022-01-08 NOTE — Progress Notes (Signed)
Report given to PACU, vss 

## 2022-01-08 NOTE — Op Note (Signed)
[facility] Patient Name: Theresa Hinton Procedure Date: 01/08/2022 10:06 AM MRN: [id number] Endoscopist: Thornton Park MD, MD Age: 46 Referring MD:  Date of Birth: 06-30-1975 Gender: Female Account #: [id number] Procedure:                Colonoscopy Indications:              Screening for colorectal malignant neoplasm                           Unsuccessful colonoscopy 11/2021 due to poor prep Medicines:                Monitored Anesthesia Care Procedure:                Pre-Anesthesia Assessment:                           - Prior to the procedure, a History and Physical                            was performed, and patient medications and                            allergies were reviewed. The patient's tolerance of                            previous anesthesia was also reviewed. The risks                            and benefits of the procedure and the sedation                            options and risks were discussed with the patient.                            All questions were answered, and informed consent                            was obtained. Prior Anticoagulants: The patient has                            taken no previous anticoagulant or antiplatelet                            agents. ASA Grade Assessment: II - A patient with                            mild systemic disease. After reviewing the risks                            and benefits, the patient was deemed in                            satisfactory condition to undergo the procedure.  After obtaining informed consent, the colonoscope                            was passed under direct vision. Throughout the                            procedure, the patient's blood pressure, pulse, and                            oxygen saturations were monitored continuously. The                            Colonoscope was introduced through the anus and                             advanced to the 3 cm into the ileum. A second                            forward view of the right colon was performed. The                            colonoscopy was performed without difficulty. The                            patient tolerated the procedure well. The quality                            of the bowel preparation was good. The terminal                            ileum, ileocecal valve, appendiceal orifice, and                            rectum were photographed. Scope In: 10:26:44 AM Scope Out: 10:39:13 AM Scope Withdrawal Time: 0 hours 11 minutes 4 seconds  Total Procedure Duration: 0 hours 12 minutes 29 seconds  Findings:                 The perianal and digital rectal examinations were                            normal.                           Non-bleeding internal hemorrhoids were found.                           A 3 mm polyp was found in the sigmoid colon. The                            polyp was sessile. The polyp was removed with a                            cold snare. Resection and retrieval were complete.  Estimated blood loss was minimal.                           A less than 1 mm polyp was found in the cecum. The                            polyp was sessile. The polyp was removed with a                            cold biopsy forceps. Resection and retrieval were                            complete. Estimated blood loss was minimal.                           The exam was otherwise without abnormality on                            direct and retroflexion views. Complications:            No immediate complications. Estimated Blood Loss:     Estimated blood loss was minimal. Impression:               - Non-bleeding internal hemorrhoids.                           - One 3 mm polyp in the sigmoid colon, removed with                            a cold snare. Resected and retrieved.                           - One less than 1 mm polyp in the  cecum, removed                            with a cold biopsy forceps. Resected and retrieved.                           - The examination was otherwise normal on direct                            and retroflexion views. Recommendation:           - Patient has a contact number available for                            emergencies. The signs and symptoms of potential                            delayed complications were discussed with the                            patient. Return to normal activities tomorrow.  Written discharge instructions were provided to the                            patient.                           - Resume previous diet.                           - Continue present medications.                           - Await pathology results.                           - Repeat colonoscopy date to be determined after                            pending pathology results are reviewed for                            surveillance. Two day bowel prep at that time.                           - Emerging evidence supports eating a diet of                            fruits, vegetables, grains, calcium, and yogurt                            while reducing red meat and alcohol may reduce the                            risk of colon cancer.                           - Thank you for allowing me to be involved in your                            colon cancer prevention. Thornton Park MD, MD 01/08/2022 10:45:25 AM This report has been signed electronically.

## 2022-01-08 NOTE — Progress Notes (Signed)
Pt's states no medical or surgical changes since previsit or office visit.  Interpreter used today at the [facility] for this pt.  Interpreter's name is- Raquel

## 2022-01-08 NOTE — Patient Instructions (Signed)
Please read handouts provided. Continue present medications. Await pathology results. High Fiber Diet encouraged.   USTED TUVO UN PROCEDIMIENTO ENDOSCPICO HOY EN EL [facility]:   Lea el informe del procedimiento que se le entreg para cualquier pregunta especfica sobre lo que se Primary school teacher.  Si el informe del examen no responde a sus preguntas, por favor llame a su gastroenterlogo para aclararlo.  Si usted solicit que no se le den [employer] de lo que se Estate manager/land agent en su procedimiento al [employer] va a cuidar, entonces el informe del procedimiento se ha incluido en un sobre sellado para que usted lo revise despus cuando le sea ms conveniente.   LO QUE PUEDE ESPERAR: Algunas sensaciones de hinchazn en el abdomen.  Puede tener ms gases de lo normal.  El caminar puede ayudarle a eliminar el aire que se le puso en el tracto gastrointestinal durante el procedimiento y reducir la hinchazn.  Si le hicieron una endoscopia inferior (como una colonoscopia o una sigmoidoscopia flexible), podra notar manchas de sangre en las heces fecales o en el papel higinico.  Si se someti a una preparacin intestinal para su procedimiento, es posible que no tenga una evacuacin intestinal normal durante [employer].   Tenga en cuenta:  Es posible que note un poco de irritacin y congestin en la nariz o algn drenaje.  Esto es debido al oxgeno [employer] durante su procedimiento.  No hay que preocuparse y esto debe desaparecer ms o Scientist, research (medical).   SNTOMAS PARA REPORTAR INMEDIATAMENTE:  Despus de una endoscopia inferior (colonoscopia o sigmoidoscopia flexible):  Cantidades excesivas de sangre en las heces fecales  Sensibilidad significativa o empeoramiento de los dolores abdominales   Hinchazn aguda del abdomen que antes no tena   Fiebre de 100F o ms      Para asuntos urgentes o de Freight forwarder, puede comunicarse con un gastroenterlogo a cualquier hora llamando al  [phone].  DIETA:  Recomendamos una comida pequea al principio, pero luego puede continuar con su dieta normal.  Tome muchos lquidos, Teacher, adult education las bebidas alcohlicas durante 24 horas.    ACTIVIDAD:  Debe planear tomarse las cosas con calma por el resto del da y no debe CONDUCIR ni usar maquinaria pesada Programmer, applications (debido a los medicamentos de sedacin utilizados durante el examen).     SEGUIMIENTO: Nuestro personal llamar al nmero que aparece en su historial al siguiente da hbil de su procedimiento para ver cmo se siente y para responder cualquier pregunta o inquietud que pueda tener con respecto a la informacin que se le dio despus del procedimiento. Si no podemos contactarle, le dejaremos un mensaje.  Sin embargo, si se siente bien y no tiene Paediatric nurse, no es necesario que nos devuelva la llamada.  Asumiremos que ha regresado a sus actividades diarias normales sin incidentes. Si se le tomaron algunas biopsias, le contactaremos por telfono o por carta en las prximas 3 semanas.  Si no ha sabido [employer] biopsias en el transcurso de 3 semanas, por favor llmenos al [phone].   FIRMAS/CONFIDENCIALIDAD: Usted y/o el acompaante que le cuide han firmado documentos que se ingresarn en su historial mdico electrnico.  Estas firmas atestiguan el hecho de que la informacin anterior

## 2022-01-09 ENCOUNTER — Telehealth: Payer: Self-pay

## 2022-01-09 NOTE — Telephone Encounter (Signed)
  Follow up Call-     01/08/2022    9:43 AM 12/01/2021   10:05 AM  Call back number  Post procedure Call Back phone  # [phone] needs interpreter, umderstands some english [phone]  Permission to leave phone message Yes Yes     Patient questions:  Do you have a fever, pain , or abdominal swelling? No. Pain Score  0 *  Have you tolerated food without any problems? Yes.    Have you been able to return to your normal activities? Yes.    Do you have any questions about your discharge instructions: Diet   No. Medications  No. Follow up visit  No.  Do you have questions or concerns about your Care? No.  Actions: * If pain score is 4 or above: No action needed, pain <4.

## 2022-01-14 ENCOUNTER — Encounter: Payer: Self-pay | Admitting: Gastroenterology

## 2022-02-18 ENCOUNTER — Telehealth: Payer: Self-pay

## 2022-02-18 NOTE — Telephone Encounter (Signed)
Left message for patient to call back with interpreter services. Needs reminder to turn in stool sample & come off PPI two weeks prior.

## 2022-02-19 NOTE — Telephone Encounter (Signed)
Spoke with patient with interpreter services & reminded her to come in for stool sample. She's been advised on when/where to go. She's not currently on any PPI's.

## 2022-02-20 ENCOUNTER — Other Ambulatory Visit: Payer: Commercial Managed Care - HMO

## 2022-03-03 ENCOUNTER — Other Ambulatory Visit: Payer: Commercial Managed Care - HMO

## 2022-03-03 DIAGNOSIS — A048 Other specified bacterial intestinal infections: Secondary | ICD-10-CM

## 2022-03-05 LAB — H. PYLORI ANTIGEN, STOOL: H pylori Ag, Stl: NEGATIVE

## 2022-11-20 ENCOUNTER — Telehealth: Payer: Self-pay | Admitting: Family Medicine

## 2022-11-20 NOTE — Telephone Encounter (Signed)
Called patient to schedule next available appointment, unable to leave VM

## 2023-01-21 IMAGING — US US ABDOMEN COMPLETE W/ ELASTOGRAPHY
1 series · 2 of 2 positions shown · non-contrast
Comparison: 08/26/2021 abdominal ultrasound

CLINICAL DATA: Hepatomegaly, abnormal liver ultrasound

EXAM:
ULTRASOUND ABDOMEN
ULTRASOUND HEPATIC ELASTOGRAPHY
TECHNIQUE: Sonography of the upper abdomen was performed. In addition,
ultrasound elastography evaluation of the liver was performed. A
region of interest was placed within the right lobe of the liver.
Following application of a compressive sonographic pulse, tissue
compressibility was assessed. Multiple assessments were performed at
the selected site. Median tissue compressibility was determined.
Previously, hepatic stiffness was assessed by shear wave velocity.
Based on recently published Society of [employer] in Ultrasound
consensus article, reporting is now recommended to be performed in
the SI units of pressure (kiloPascals) representing hepatic
stiffness/elasticity. The obtained result is compared to the
published reference standards. (cACLD = compensated Advanced Chronic
Liver Disease)

[Series 1: us abdomen complete w/elastography · 2 of 2 slices shown]
[im 1/2]
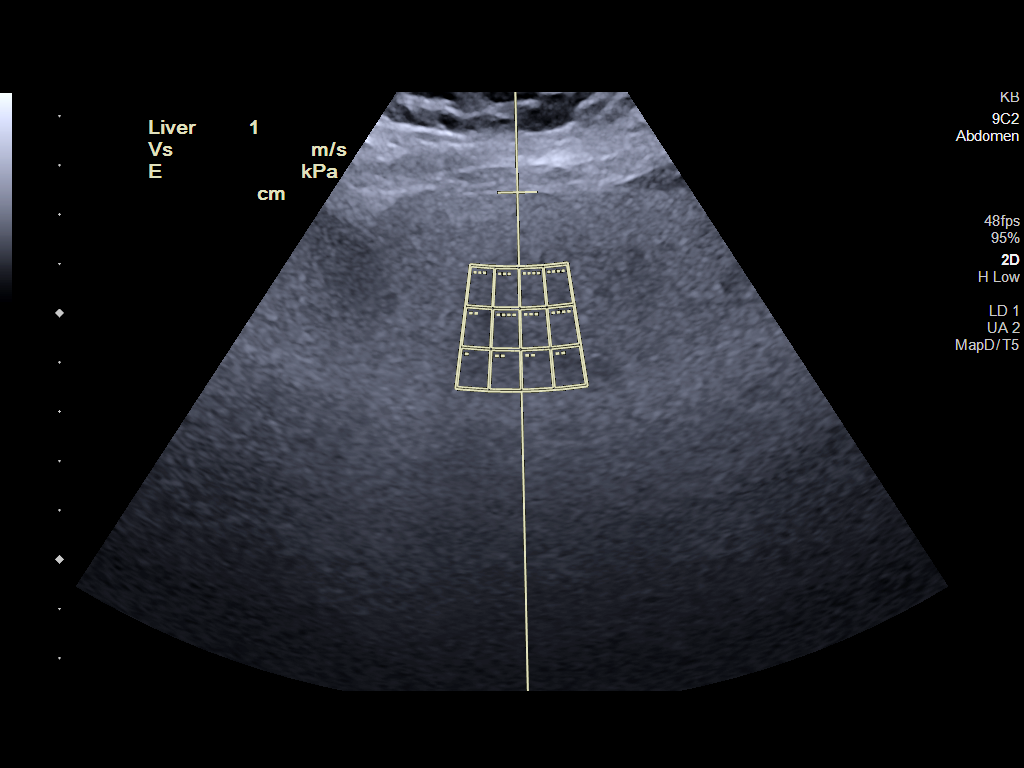
[im 2/2]
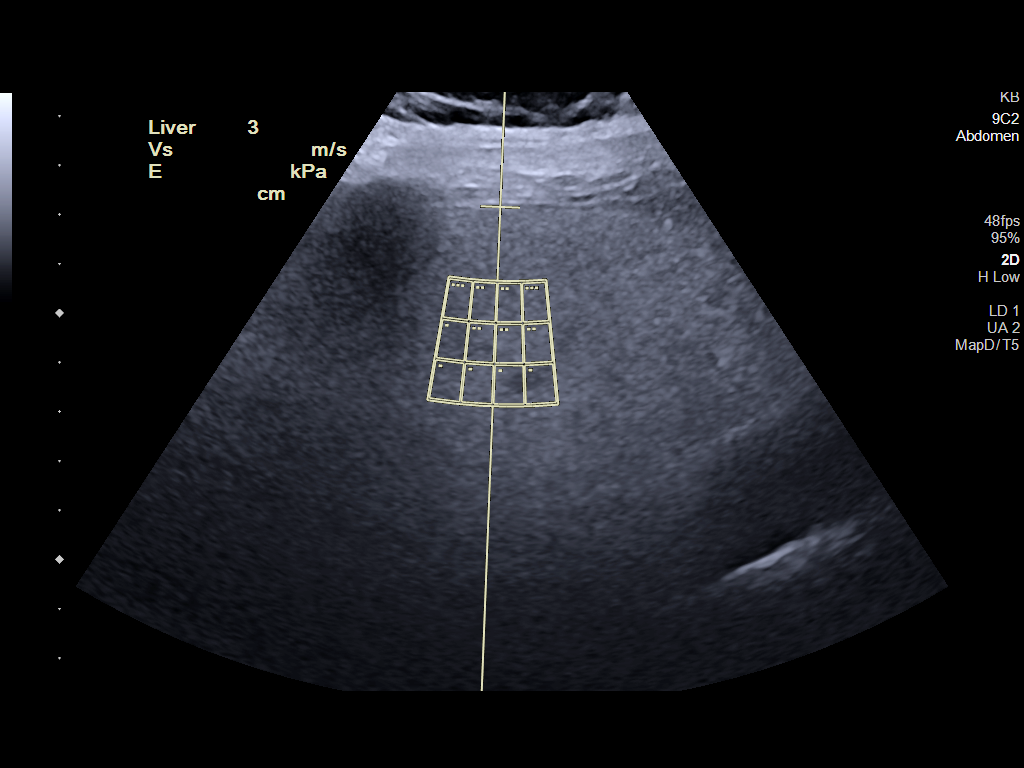

[2 of 2 positions shown; findings below may reference images not displayed]

FINDINGS: ULTRASOUND ABDOMEN

Gallbladder: Normally distended without stones or wall thickening.
No pericholecystic fluid or sonographic Murphy sign.

Common bile duct: Diameter: 3 mm, normal

Liver: Echogenic parenchyma, likely fatty infiltration though this
can be seen with cirrhosis and certain infiltrative disorders. No
focal hepatic mass or nodularity. No intrahepatic biliary
dilatation. Portal vein is patent on color Doppler imaging with
normal direction of blood flow towards the liver.

IVC: Normal appearance

Pancreas: Normal appearance

Spleen: Normal appearance, 6.5 cm length

Right Kidney: Length: 11.1 cm. Normal morphology without mass or
hydronephrosis.

Left Kidney: Length: 10.0 cm. Normal morphology without mass or
hydronephrosis.

Abdominal aorta: Normal caliber

Other findings: No free fluid

ULTRASOUND HEPATIC ELASTOGRAPHY

Device: Siemens Helix VTQ

Patient position: Not recorded

Transducer [id number]

Number of measurements: 12

Hepatic segment:  8

Median kPa:

IQR:

IQR/Median kPa ratio:

Data quality: IQR/Median kPa ratio of 0.3 or greater indicates
reduced accuracy

Diagnostic category: < or = 9 kPa: in the absence of other known
clinical signs, rules out cACLD

The use of hepatic elastography is applicable to patients with viral
hepatitis and non-alcoholic fatty liver disease. At this time, there
is insufficient data for the referenced cut-off values and use in
other causes of liver disease, including alcoholic liver disease.
Patients, however, may be assessed by elastography and serve as
their own reference standard/baseline.

In patients with non-alcoholic liver disease, the values suggesting
compensated advanced chronic liver disease (cACLD) may be lower, and
patients may need additional testing with elasticity results of [DATE]
kPa.

Please note that abnormal hepatic elasticity and shear wave
velocities may also be identified in clinical settings other than
with hepatic fibrosis, such as: acute hepatitis, elevated right
heart and central venous pressures including use of beta blockers,
Chinenye disease (Bolling), infiltrative processes such as
mastocytosis/amyloidosis/infiltrative tumor/lymphoma, extrahepatic
cholestasis, with hyperemia in the post-prandial state, and with
liver transplantation. Correlation with patient history, laboratory
data, and clinical condition recommended.

Diagnostic Categories:

< or =5 kPa: high probability of being normal

< or =9 kPa: in the absence of other known clinical signs, rules [DATE] kPa and ?13 kPa: suggestive of cACLD, but needs further testing

>13 kPa: highly suggestive of cACLD

> or =17 kPa: highly suggestive of cACLD with an increased
probability of clinically significant portal hypertension
IMPRESSION: ULTRASOUND ABDOMEN:

Probable fatty infiltration of liver.

No other upper abdominal sonographic abnormalities.

ULTRASOUND HEPATIC ELASTOGRAPHY:

Median kPa:

Diagnostic category: < or = 9 kPa: in the absence of other known
clinical signs, rules out cACLD
